# Patient Record
Sex: Female | Born: 1995 | Race: Black or African American | Hispanic: No | Marital: Single | Smoking: Never smoker
Health system: Southern US, Community
[De-identification: ages and names within clinical notes are randomized; demographics above are authoritative.]

## PROBLEM LIST (undated history)

## (undated) DIAGNOSIS — D649 Anemia, unspecified: Secondary | ICD-10-CM

---

## 2019-11-11 ENCOUNTER — Inpatient Hospital Stay (HOSPITAL_COMMUNITY)
Admission: EM | Admit: 2019-11-11 | Discharge: 2019-11-14 | DRG: 871 | Disposition: A | Discharge status: Home / Self Care | Payer: Self-pay | Attending: Internal Medicine | Admitting: Internal Medicine

## 2019-11-11 ENCOUNTER — Encounter (HOSPITAL_COMMUNITY): Payer: Self-pay | Admitting: Emergency Medicine

## 2019-11-11 ENCOUNTER — Emergency Department (HOSPITAL_COMMUNITY): Payer: Self-pay

## 2019-11-11 DIAGNOSIS — D649 Anemia, unspecified: Secondary | ICD-10-CM

## 2019-11-11 DIAGNOSIS — R112 Nausea with vomiting, unspecified: Secondary | ICD-10-CM

## 2019-11-11 DIAGNOSIS — E871 Hypo-osmolality and hyponatremia: Secondary | ICD-10-CM | POA: Diagnosis present

## 2019-11-11 DIAGNOSIS — E669 Obesity, unspecified: Secondary | ICD-10-CM | POA: Diagnosis present

## 2019-11-11 DIAGNOSIS — U071 COVID-19: Secondary | ICD-10-CM | POA: Diagnosis present

## 2019-11-11 DIAGNOSIS — E872 Acidosis: Secondary | ICD-10-CM | POA: Diagnosis present

## 2019-11-11 DIAGNOSIS — N920 Excessive and frequent menstruation with regular cycle: Secondary | ICD-10-CM | POA: Diagnosis present

## 2019-11-11 DIAGNOSIS — J1282 Pneumonia due to coronavirus disease 2019: Secondary | ICD-10-CM | POA: Diagnosis present

## 2019-11-11 DIAGNOSIS — A4189 Other specified sepsis: Principal | ICD-10-CM | POA: Diagnosis present

## 2019-11-11 DIAGNOSIS — A419 Sepsis, unspecified organism: Secondary | ICD-10-CM

## 2019-11-11 DIAGNOSIS — R0602 Shortness of breath: Secondary | ICD-10-CM

## 2019-11-11 DIAGNOSIS — J189 Pneumonia, unspecified organism: Secondary | ICD-10-CM | POA: Diagnosis present

## 2019-11-11 DIAGNOSIS — D500 Iron deficiency anemia secondary to blood loss (chronic): Secondary | ICD-10-CM | POA: Diagnosis present

## 2019-11-11 DIAGNOSIS — R739 Hyperglycemia, unspecified: Secondary | ICD-10-CM | POA: Diagnosis not present

## 2019-11-11 DIAGNOSIS — Z6837 Body mass index (BMI) 37.0-37.9, adult: Secondary | ICD-10-CM

## 2019-11-11 DIAGNOSIS — D573 Sickle-cell trait: Secondary | ICD-10-CM | POA: Diagnosis present

## 2019-11-11 DIAGNOSIS — E876 Hypokalemia: Secondary | ICD-10-CM | POA: Diagnosis present

## 2019-11-11 HISTORY — DX: Anemia, unspecified: D64.9

## 2019-11-11 LAB — CBC
HCT: 22.6 % — ABNORMAL LOW (ref 36.0–46.0)
Hemoglobin: 5.9 g/dL — CL (ref 12.0–15.0)
MCH: 14.9 pg — ABNORMAL LOW (ref 26.0–34.0)
MCHC: 26.1 g/dL — ABNORMAL LOW (ref 30.0–36.0)
MCV: 56.9 fL — ABNORMAL LOW (ref 80.0–100.0)
Platelets: 192 10*3/uL (ref 150–400)
RBC: 3.97 MIL/uL (ref 3.87–5.11)
RDW: 22.2 % — ABNORMAL HIGH (ref 11.5–15.5)
WBC: 3.9 10*3/uL — ABNORMAL LOW (ref 4.0–10.5)
nRBC: 0.5 % — ABNORMAL HIGH (ref 0.0–0.2)

## 2019-11-11 LAB — BASIC METABOLIC PANEL
Anion gap: 10 (ref 5–15)
BUN: <5 mg/dL — ABNORMAL LOW (ref 6–20)
CO2: 23 mmol/L (ref 22–32)
Calcium: 8.6 mg/dL — ABNORMAL LOW (ref 8.9–10.3)
Chloride: 100 mmol/L (ref 98–111)
Creatinine, Ser: 0.86 mg/dL (ref 0.44–1.00)
GFR calc Af Amer: >60 mL/min (ref >60)
GFR calc non Af Amer: >60 mL/min (ref >60)
Glucose, Bld: 108 mg/dL — ABNORMAL HIGH (ref 70–99)
Potassium: 3.3 mmol/L — ABNORMAL LOW (ref 3.5–5.1)
Sodium: 133 mmol/L — ABNORMAL LOW (ref 135–145)

## 2019-11-11 LAB — I-STAT BETA HCG BLOOD, ED (MC, WL, AP ONLY): I-stat hCG, quantitative: <5 m[IU]/mL (ref <5)

## 2019-11-11 NOTE — ED Triage Notes (Signed)
Patient reports being diagnosed with COVID last week. Reports ongoing fever/chills, body aches, nausea.

## 2019-11-12 ENCOUNTER — Encounter (HOSPITAL_COMMUNITY): Payer: Self-pay | Admitting: Emergency Medicine

## 2019-11-12 DIAGNOSIS — J1282 Pneumonia due to coronavirus disease 2019: Secondary | ICD-10-CM | POA: Diagnosis not present

## 2019-11-12 DIAGNOSIS — U071 COVID-19: Secondary | ICD-10-CM | POA: Diagnosis not present

## 2019-11-12 DIAGNOSIS — B342 Coronavirus infection, unspecified: Secondary | ICD-10-CM

## 2019-11-12 DIAGNOSIS — N920 Excessive and frequent menstruation with regular cycle: Secondary | ICD-10-CM

## 2019-11-12 DIAGNOSIS — D649 Anemia, unspecified: Secondary | ICD-10-CM

## 2019-11-12 DIAGNOSIS — J189 Pneumonia, unspecified organism: Secondary | ICD-10-CM | POA: Diagnosis present

## 2019-11-12 DIAGNOSIS — A419 Sepsis, unspecified organism: Secondary | ICD-10-CM

## 2019-11-12 DIAGNOSIS — R112 Nausea with vomiting, unspecified: Secondary | ICD-10-CM

## 2019-11-12 HISTORY — DX: COVID-19: U07.1

## 2019-11-12 HISTORY — DX: Coronavirus infection, unspecified: B34.2

## 2019-11-12 LAB — LACTATE DEHYDROGENASE
LDH: 385 U/L — ABNORMAL HIGH (ref 98–192)
LDH: 231 U/L — ABNORMAL HIGH (ref 98–192)

## 2019-11-12 LAB — FERRITIN
Ferritin: 11 ng/mL (ref 11–307)
Ferritin: 13 ng/mL (ref 11–307)
Ferritin: 11 ng/mL (ref 11–307)

## 2019-11-12 LAB — RETICULOCYTES
Immature Retic Fract: 23.5 % — ABNORMAL HIGH (ref 2.3–15.9)
RBC.: 4.07 MIL/uL (ref 3.87–5.11)
Retic Count, Absolute: 44.8 10*3/uL (ref 19.0–186.0)
Retic Ct Pct: 1.1 % (ref 0.4–3.1)

## 2019-11-12 LAB — CBC WITH DIFFERENTIAL/PLATELET
Abs Immature Granulocytes: 0.03 10*3/uL (ref 0.00–0.07)
Basophils Absolute: 0 10*3/uL (ref 0.0–0.1)
Basophils Relative: 0 %
Eosinophils Absolute: 0 10*3/uL (ref 0.0–0.5)
Eosinophils Relative: 0 %
HCT: 30.3 % — ABNORMAL LOW (ref 36.0–46.0)
Hemoglobin: 8.6 g/dL — ABNORMAL LOW (ref 12.0–15.0)
Immature Granulocytes: 1 %
Lymphocytes Relative: 22 %
Lymphs Abs: 0.8 10*3/uL (ref 0.7–4.0)
MCH: 18 pg — ABNORMAL LOW (ref 26.0–34.0)
MCHC: 28.4 g/dL — ABNORMAL LOW (ref 30.0–36.0)
MCV: 63.5 fL — ABNORMAL LOW (ref 80.0–100.0)
Monocytes Absolute: 0.1 10*3/uL (ref 0.1–1.0)
Monocytes Relative: 1 %
Neutro Abs: 2.7 10*3/uL (ref 1.7–7.7)
Neutrophils Relative %: 76 %
Platelets: 184 10*3/uL (ref 150–400)
RBC: 4.77 MIL/uL (ref 3.87–5.11)
RDW: 28.8 % — ABNORMAL HIGH (ref 11.5–15.5)
WBC: 3.6 10*3/uL — ABNORMAL LOW (ref 4.0–10.5)
nRBC: 0 % (ref 0.0–0.2)

## 2019-11-12 LAB — FOLATE: Folate: 28.8 ng/mL (ref >5.9)

## 2019-11-12 LAB — HEPATIC FUNCTION PANEL
ALT: 21 U/L (ref 0–44)
AST: 52 U/L — ABNORMAL HIGH (ref 15–41)
Albumin: 3.4 g/dL — ABNORMAL LOW (ref 3.5–5.0)
Alkaline Phosphatase: 33 U/L — ABNORMAL LOW (ref 38–126)
Bilirubin, Direct: 0.3 mg/dL — ABNORMAL HIGH (ref 0.0–0.2)
Indirect Bilirubin: 0.8 mg/dL (ref 0.3–0.9)
Total Bilirubin: 1.1 mg/dL (ref 0.3–1.2)
Total Protein: 7.1 g/dL (ref 6.5–8.1)

## 2019-11-12 LAB — SARS CORONAVIRUS 2 BY RT PCR (HOSPITAL ORDER, PERFORMED IN ~~LOC~~ HOSPITAL LAB): SARS Coronavirus 2: POSITIVE — AB

## 2019-11-12 LAB — D-DIMER, QUANTITATIVE
D-Dimer, Quant: 1.06 ug/mL-FEU — ABNORMAL HIGH (ref 0.00–0.50)
D-Dimer, Quant: 1.01 ug/mL-FEU — ABNORMAL HIGH (ref 0.00–0.50)

## 2019-11-12 LAB — PROCALCITONIN: Procalcitonin: <0.1 ng/mL

## 2019-11-12 LAB — COMPREHENSIVE METABOLIC PANEL
ALT: 19 U/L (ref 0–44)
AST: 26 U/L (ref 15–41)
Albumin: 3.1 g/dL — ABNORMAL LOW (ref 3.5–5.0)
Alkaline Phosphatase: 39 U/L (ref 38–126)
Anion gap: 9 (ref 5–15)
BUN: 5 mg/dL — ABNORMAL LOW (ref 6–20)
CO2: 21 mmol/L — ABNORMAL LOW (ref 22–32)
Calcium: 8.4 mg/dL — ABNORMAL LOW (ref 8.9–10.3)
Chloride: 107 mmol/L (ref 98–111)
Creatinine, Ser: 0.76 mg/dL (ref 0.44–1.00)
GFR calc Af Amer: >60 mL/min (ref >60)
GFR calc non Af Amer: >60 mL/min (ref >60)
Glucose, Bld: 196 mg/dL — ABNORMAL HIGH (ref 70–99)
Potassium: 3.7 mmol/L (ref 3.5–5.1)
Sodium: 137 mmol/L (ref 135–145)
Total Bilirubin: 0.5 mg/dL (ref 0.3–1.2)
Total Protein: 6.9 g/dL (ref 6.5–8.1)

## 2019-11-12 LAB — PREPARE RBC (CROSSMATCH)

## 2019-11-12 LAB — FIBRINOGEN
Fibrinogen: 355 mg/dL (ref 210–475)
Fibrinogen: 394 mg/dL (ref 210–475)

## 2019-11-12 LAB — PHOSPHORUS: Phosphorus: 2.4 mg/dL — ABNORMAL LOW (ref 2.5–4.6)

## 2019-11-12 LAB — C-REACTIVE PROTEIN
CRP: 1.2 mg/dL — ABNORMAL HIGH (ref <1.0)
CRP: 1.6 mg/dL — ABNORMAL HIGH (ref <1.0)

## 2019-11-12 LAB — ABO/RH: ABO/RH(D): A POS

## 2019-11-12 LAB — SEDIMENTATION RATE: Sed Rate: 22 mm/hr (ref 0–22)

## 2019-11-12 LAB — MAGNESIUM
Magnesium: 1.8 mg/dL (ref 1.7–2.4)
Magnesium: 2 mg/dL (ref 1.7–2.4)

## 2019-11-12 LAB — LACTIC ACID, PLASMA: Lactic Acid, Venous: 1.6 mmol/L (ref 0.5–1.9)

## 2019-11-12 LAB — IRON AND TIBC
Iron: 13 ug/dL — ABNORMAL LOW (ref 28–170)
Saturation Ratios: 3 % — ABNORMAL LOW (ref 10.4–31.8)
TIBC: 372 ug/dL (ref 250–450)
UIBC: 359 ug/dL

## 2019-11-12 LAB — HIV ANTIBODY (ROUTINE TESTING W REFLEX): HIV Screen 4th Generation wRfx: NONREACTIVE

## 2019-11-12 LAB — VITAMIN B12: Vitamin B-12: 751 pg/mL (ref 180–914)

## 2019-11-12 LAB — LIPASE, BLOOD: Lipase: 24 U/L (ref 11–51)

## 2019-11-12 MED ORDER — ONDANSETRON HCL 4 MG/2ML IJ SOLN: 4.0000 mg | Freq: Four times a day (QID) | INTRAMUSCULAR | Status: DC | PRN

## 2019-11-12 MED ORDER — HYDROCOD POLST-CPM POLST ER 10-8 MG/5ML PO SUER: 5.0000 mL | Freq: Two times a day (BID) | ORAL | Status: DC | PRN

## 2019-11-12 MED ORDER — GUAIFENESIN-DM 100-10 MG/5ML PO SYRP: 10.0000 mL | ORAL_SOLUTION | ORAL | Status: DC | PRN

## 2019-11-12 MED ORDER — SODIUM CHLORIDE 0.9 % IV SOLN
10.0000 mL/h | Freq: Once | INTRAVENOUS | Status: AC
  Administered 2019-11-12: 10 mL/h via INTRAVENOUS

## 2019-11-12 MED ORDER — SODIUM CHLORIDE 0.9 % IV SOLN
200.0000 mg | Freq: Once | INTRAVENOUS | Status: AC
  Administered 2019-11-12: 200 mg via INTRAVENOUS
  Filled 2019-11-12: qty 40

## 2019-11-12 MED ORDER — ACETAMINOPHEN 325 MG PO TABS
650.0000 mg | ORAL_TABLET | Freq: Four times a day (QID) | ORAL | Status: DC | PRN
  Administered 2019-11-12: 650 mg via ORAL
  Filled 2019-11-12: qty 2

## 2019-11-12 MED ORDER — SODIUM CHLORIDE 0.9 % IV BOLUS
1000.0000 mL | Freq: Once | INTRAVENOUS | Status: AC
  Administered 2019-11-12: 1000 mL via INTRAVENOUS

## 2019-11-12 MED ORDER — METHYLPREDNISOLONE SODIUM SUCC 125 MG IJ SOLR
0.5000 mg/kg | Freq: Two times a day (BID) | INTRAMUSCULAR | Status: DC
  Administered 2019-11-12 – 2019-11-14 (×5): 53.125 mg via INTRAVENOUS
  Filled 2019-11-12 (×5): qty 2

## 2019-11-12 MED ORDER — LACTATED RINGERS IV BOLUS
1000.0000 mL | Freq: Once | INTRAVENOUS | Status: AC
  Administered 2019-11-12: 1000 mL via INTRAVENOUS

## 2019-11-12 MED ORDER — ONDANSETRON HCL 4 MG/2ML IJ SOLN
4.0000 mg | Freq: Once | INTRAMUSCULAR | Status: AC
  Administered 2019-11-12: 4 mg via INTRAVENOUS
  Filled 2019-11-12: qty 2

## 2019-11-12 MED ORDER — VITAMIN D 25 MCG (1000 UNIT) PO TABS
1000.0000 [IU] | ORAL_TABLET | Freq: Every day | ORAL | Status: DC
  Administered 2019-11-12 – 2019-11-14 (×3): 1000 [IU] via ORAL
  Filled 2019-11-12 (×3): qty 1

## 2019-11-12 MED ORDER — POTASSIUM CHLORIDE CRYS ER 20 MEQ PO TBCR
40.0000 meq | EXTENDED_RELEASE_TABLET | Freq: Once | ORAL | Status: AC
  Administered 2019-11-12: 40 meq via ORAL
  Filled 2019-11-12: qty 2

## 2019-11-12 MED ORDER — ASCORBIC ACID 500 MG PO TABS
500.0000 mg | ORAL_TABLET | Freq: Every day | ORAL | Status: DC
  Administered 2019-11-12 – 2019-11-14 (×3): 500 mg via ORAL
  Filled 2019-11-12 (×3): qty 1

## 2019-11-12 MED ORDER — SODIUM CHLORIDE 0.9 % IV SOLN
100.0000 mg | Freq: Every day | INTRAVENOUS | Status: DC
  Administered 2019-11-13 – 2019-11-14 (×2): 100 mg via INTRAVENOUS
  Filled 2019-11-12 (×3): qty 20

## 2019-11-12 MED ORDER — K PHOS MONO-SOD PHOS DI & MONO 155-852-130 MG PO TABS
500.0000 mg | ORAL_TABLET | Freq: Two times a day (BID) | ORAL | Status: AC
  Administered 2019-11-12: 500 mg via ORAL
  Filled 2019-11-12 (×2): qty 2

## 2019-11-12 MED ORDER — ZINC SULFATE 220 (50 ZN) MG PO CAPS
220.0000 mg | ORAL_CAPSULE | Freq: Every day | ORAL | Status: DC
  Administered 2019-11-12 – 2019-11-14 (×3): 220 mg via ORAL
  Filled 2019-11-12 (×3): qty 1

## 2019-11-12 MED ORDER — SODIUM CHLORIDE 0.9 % IV BOLUS: 500.0000 mL | Freq: Once | INTRAVENOUS | Status: DC

## 2019-11-12 NOTE — H&P (Signed)
History and Physical    Carly Bullock JSE:[id number] DOB: [birth year] DOA: 11/11/2019  PCP: Patient, No Pcp Per Patient coming from: Home  Chief Complaint: Vomiting, Covid positive  HPI: Carly Bullock is a 24 y.o. female with medical history significant of anemia presenting to the ED for evaluation of nausea and vomiting after recently testing positive for Covid last week.  Patient reports having a cough for several days and feeling weak.  States she is tested positive for Covid on 8/18.  For the last 3 days she is having nausea and vomiting.  She has not been able to tolerate any p.o. intake.  Denies abdominal pain or diarrhea.  Denies lightheadedness/dizziness, shortness of breath, chest pain, or palpitations.  Patient thinks she had a miscarriage back in February and since then she has continued to have heavy menstrual cycles.  Each menstrual cycle lasts 2 weeks at a time.  She has not seen a gynecologist.  Denies hematemesis, hematochezia, or melena.  Reports history of sickle cell trait.  ED Course: Febrile and tachycardic.  Not tachypneic and satting 100% on room air.  WBC 3.9, hemoglobin 5.9, hematocrit 22.6, MCV 56.9, and platelet count 192k.  Sodium 133, potassium 3.3, chloride 100, bicarb 23, BUN <5, creatinine 0.8, and glucose 108.  Beta hCG negative.  Chest x-ray showing patchy airspace opacities in the right lung base concerning for pneumonia.  Patient was given Zofran, potassium supplementation, and 1 L LR bolus.  2 units PRBCs ordered.  Review of Systems:  All systems reviewed and apart from history of presenting illness, are negative.  Past Medical History:  Diagnosis Date  . Anemia     History reviewed. No pertinent surgical history.   reports that she has never smoked. She has never used smokeless tobacco. She reports current alcohol use. She reports previous drug use.  No Known Allergies  Family History  Problem Relation Age of Onset  . Breast cancer Mother      Prior to Admission medications   Not on File    Physical Exam: Vitals:   11/11/19 2203 11/11/19 2208 11/12/19 0101 11/12/19 0250  BP: 121/65  117/70 111/73  Pulse: (!) 117  (!) 104 (!) 106  Resp: 16  15 20   Temp: 100.2 F (37.9 C)  (!) 100.7 F (38.2 C) (!) 100.8 F (38.2 C)  TempSrc: Oral  Oral Oral  SpO2: 100%  100% 100%  Weight:  106.6 kg    Height:  5\' 5"  (1.651 m)      Physical Exam Constitutional:      Comments: Appears lethargic  HENT:     Head: Normocephalic and atraumatic.  Eyes:     Extraocular Movements: Extraocular movements intact.     Conjunctiva/sclera: Conjunctivae normal.  Cardiovascular:     Rate and Rhythm: Normal rate and regular rhythm.     Pulses: Normal pulses.  Pulmonary:     Effort: Pulmonary effort is normal. No respiratory distress.     Breath sounds: Normal breath sounds. No wheezing or rales.  Abdominal:     General: Bowel sounds are normal. There is no distension.     Palpations: Abdomen is soft.     Tenderness: There is no abdominal tenderness.  Musculoskeletal:        General: No swelling or tenderness.     Cervical back: Normal range of motion and neck supple.  Skin:    General: Skin is warm and dry.  Neurological:     General: No focal deficit  present.     Mental Status: She is alert and oriented to person, place, and time.     Labs on Admission: I have personally reviewed following labs and imaging studies  CBC: Recent Labs  Lab 11/11/19 2213  WBC 3.9*  HGB 5.9*  HCT 22.6*  MCV 56.9*  PLT 192   Basic Metabolic Panel: Recent Labs  Lab 11/11/19 2213  NA 133*  K 3.3*  CL 100  CO2 23  GLUCOSE 108*  BUN <5*  CREATININE 0.86  CALCIUM 8.6*   GFR: Estimated Creatinine Clearance: 120.2 mL/min (by C-G formula based on SCr of 0.86 mg/dL). Liver Function Tests: No results for input(s): AST, ALT, ALKPHOS, BILITOT, PROT, ALBUMIN in the last 168 hours. No results for input(s): LIPASE, AMYLASE in the last 168  hours. No results for input(s): AMMONIA in the last 168 hours. Coagulation Profile: No results for input(s): INR, PROTIME in the last 168 hours. Cardiac Enzymes: No results for input(s): CKTOTAL, CKMB, CKMBINDEX, TROPONINI in the last 168 hours. BNP (last 3 results) No results for input(s): PROBNP in the last 8760 hours. HbA1C: No results for input(s): HGBA1C in the last 72 hours. CBG: No results for input(s): GLUCAP in the last 168 hours. Lipid Profile: No results for input(s): CHOL, HDL, LDLCALC, TRIG, CHOLHDL, LDLDIRECT in the last 72 hours. Thyroid Function Tests: No results for input(s): TSH, T4TOTAL, FREET4, T3FREE, THYROIDAB in the last 72 hours. Anemia Panel: Recent Labs    11/12/19 0046  VITAMINB12 751  FOLATE 28.8  FERRITIN 11  TIBC 372  IRON 13*  RETICCTPCT 1.1   Urine analysis: No results found for: COLORURINE, APPEARANCEUR, LABSPEC, PHURINE, GLUCOSEU, HGBUR, BILIRUBINUR, KETONESUR, PROTEINUR, UROBILINOGEN, NITRITE, LEUKOCYTESUR  Radiological Exams on Admission: DG Chest Port 1 View  Result Date: 11/11/2019 CLINICAL DATA:  COVID-19 positivity EXAM: PORTABLE CHEST 1 VIEW COMPARISON:  None. FINDINGS: Cardiac shadows within normal limits. Patchy airspace opacities are noted in the right lung base consistent with the given clinical history. No sizable effusion is noted. No bony abnormality is noted. IMPRESSION: Patchy airspace opacities in the right base which would be consistent with the given clinical history. Electronically Signed   By: Alcide Clever M.D.   On: 11/11/2019 23:11    EKG: Ordered and currently pending.      Assessment/Plan Principal Problem:   Pneumonia due to COVID-19 virus Active Problems:   Sepsis (HCC)   Nausea and vomiting   Symptomatic anemia   Menorrhagia   Sepsis secondary to COVID-19 viral pneumonia: Tested positive for Covid 8/18.  Complaining of a cough.  Febrile and slightly tachycardic. Not hypoxic-satting 100% on room air.   Chest x-ray showing patchy airspace opacities in the right lung base concerning for pneumonia.  Bacterial pneumonia less likely given no leukocytosis on labs. -Received 1 L IV fluid bolus in the ED, order additional 1 L bolus as patient continues to be slightly tachycardic and blood pressure low with systolic around 90. -Remdesivir dosing per pharmacy -IV Solu-Medrol 0.5 mg/kg every 12 hours -Vitamin C, zinc, vitamin D -Antitussives as needed -Tylenol as needed -Check inflammatory markers including ferritin, fibrinogen, D-dimer, CRP, LDH -Check lactic acid level -Check procalcitonin level -Daily CBC with differential, CMP, CRP, D-dimer -Airborne and contact precautions -Continuous pulse ox -Supplemental oxygen as needed to keep oxygen saturation above 90% -Blood culture x2 ordered  Nausea and emesis: Suspect due to COVID-19 viral gastroenteritis.  No complaints of abdominal pain and abdominal exam benign.  Beta hCG negative.  Not  actively vomiting at this time and able to drink fluids. -Check lipase and LFTs.  Antiemetic as needed.  Symptomatic blood loss anemia/iron deficiency anemia: Hemoglobin 5.9 and MCV 56.9.  No prior labs for comparison.  Anemia panel showing very low iron saturation of 3% and low ferritin of 11.  Patient reports having heavy menstrual cycles which last 2 weeks at a time since February.  Denies any symptoms of GI bleed. -Type and screen, 2 units PRBCs ordered.  Monitor H&H.  Menorrhagia: Likely contributing to her anemia. -Please ensure gynecology follow-up.  Mild hypokalemia: In setting of intractable nausea and vomiting.  Potassium 3.3. -Potassium supplementation given.  Check magnesium level and replete if low.  Continue to monitor electrolytes.  DVT prophylaxis: SCDs Code Status: Full code Family Communication: No family available at this time. Disposition Plan: Status is: Inpatient  Remains inpatient appropriate because:IV treatments appropriate due to  intensity of illness or inability to take PO and Inpatient level of care appropriate due to severity of illness   Dispo: The patient is from: Home              Anticipated d/c is to: Home              Anticipated d/c date is: 3 days              Patient currently is not medically stable to d/c.  The medical decision making on this patient was of high complexity and the patient is at high risk for clinical deterioration, therefore this is a level 3 visit.  John Giovanni MD [facility]  If 7PM-7AM, please contact night-coverage [email or web address]  11/12/2019, 3:08 AM

## 2019-11-12 NOTE — ED Notes (Signed)
Pt given toiletries.  States she feels much better and feels like eating breakfast.

## 2019-11-12 NOTE — ED Provider Notes (Signed)
[facility] EMERGENCY DEPARTMENT Provider Note   CSN: [id number] Arrival date & time: 11/11/19  2202   History Chief Complaint  Patient presents with  . COVID+    Carly Bullock is a 24 y.o. female.  The history is provided by the patient.  She has no significant history and comes in because of vomiting for the last 3 days.  She has not been able to hold anything down.  She started getting sick 8 days ago with fever, sore throat, cough productive of clear to pale yellow sputum.  6 days ago, she was tested for COVID-19 and was found to be positive.  Other symptoms include loss of sense of smell and taste.  She has had some generalized body aches.  Fever which has been present initially, has resolved but she continues to have chills.  She is no longer having sweats.  She denies any diarrhea.  She had not been vaccinated against COVID-19.  History reviewed. No pertinent past medical history.  There are no problems to display for this patient.   History reviewed. No pertinent surgical history.   OB History   No obstetric history on file.     No family history on file.  Social History   Tobacco Use  . Smoking status: Never Smoker  . Smokeless tobacco: Never Used  Substance Use Topics  . Alcohol use: Yes    Comment: Socially  . Drug use: Not Currently    Home Medications Prior to Admission medications   Not on File    Allergies    Patient has no known allergies.  Review of Systems   Review of Systems  All other systems reviewed and are negative.   Physical Exam Updated Vital Signs BP 121/65 (BP Location: Left Arm)   Pulse (!) 117   Temp 100.2 F (37.9 C) (Oral)   Resp 16   Ht 5\' 5"  (1.651 m)   Wt 106.6 kg   SpO2 100%   BMI 39.11 kg/m   Physical Exam Vitals and nursing note reviewed.   24 year old female, resting comfortably and in no acute distress. Vital signs are significant for rapid heart rate and borderline fever. Oxygen  saturation is 100%, which is normal. Head is normocephalic and atraumatic. PERRLA, EOMI. Oropharynx is clear.  Conjunctivae are pale. Neck is nontender and supple without adenopathy or JVD. Back is nontender and there is no CVA tenderness. Lungs are clear without rales, wheezes, or rhonchi. Chest is nontender. Heart has regular rate and rhythm without murmur. Abdomen is soft, flat, nontender without masses or hepatosplenomegaly and peristalsis is hypoactive. Extremities have no cyanosis or edema, full range of motion is present. Skin is warm and dry without rash. Neurologic: Mental status is normal, cranial nerves are intact, there are no motor or sensory deficits.  ED Results / Procedures / Treatments   Labs (all labs ordered are listed, but only abnormal results are displayed) Labs Reviewed  CBC - Abnormal; Notable for the following components:      Result Value   WBC 3.9 (*)    Hemoglobin 5.9 (*)    HCT 22.6 (*)    MCV 56.9 (*)    MCH 14.9 (*)    MCHC 26.1 (*)    RDW 22.2 (*)    nRBC 0.5 (*)    All other components within normal limits  BASIC METABOLIC PANEL - Abnormal; Notable for the following components:   Sodium 133 (*)    Potassium 3.3 (*)  Glucose, Bld 108 (*)    BUN <5 (*)    Calcium 8.6 (*)    All other components within normal limits  I-STAT BETA HCG BLOOD, ED ([facility], [facility], [facility] ONLY)    EKG None  Radiology DG Chest Port 1 View  Result Date: 11/11/2019 CLINICAL DATA:  COVID-19 positivity EXAM: PORTABLE CHEST 1 VIEW COMPARISON:  None. FINDINGS: Cardiac shadows within normal limits. Patchy airspace opacities are noted in the right lung base consistent with the given clinical history. No sizable effusion is noted. No bony abnormality is noted. IMPRESSION: Patchy airspace opacities in the right base which would be consistent with the given clinical history. Electronically Signed   By: Alcide Clever M.D.   On: 11/11/2019 23:11    Procedures Procedures  CRITICAL  CARE Performed by: Dione Booze Total critical care time: 35 minutes Critical care time was exclusive of separately billable procedures and treating other patients. Critical care was necessary to treat or prevent imminent or life-threatening deterioration. Critical care was time spent personally by me on the following activities: development of treatment plan with patient and/or surrogate as well as nursing, discussions with consultants, evaluation of patient's response to treatment, examination of patient, obtaining history from patient or surrogate, ordering and performing treatments and interventions, ordering and review of laboratory studies, ordering and review of radiographic studies, pulse oximetry and re-evaluation of patient's condition.  Medications Ordered in ED Medications  0.9 %  sodium chloride infusion (has no administration in time range)  ondansetron (ZOFRAN) injection 4 mg (4 mg Intravenous Given 11/12/19 0108)  lactated ringers bolus 1,000 mL (1,000 mLs Intravenous New Bag/Given 11/12/19 0108)  potassium chloride SA (KLOR-CON) CR tablet 40 mEq (40 mEq Oral Given 11/12/19 0108)    ED Course  I have reviewed the triage vital signs and the nursing notes.  Pertinent labs & imaging results that were available during my care of the patient were reviewed by me and considered in my medical decision making (see chart for details).  MDM Rules/Calculators/A&P COVID-19 infection with nausea and vomiting.  Oxygen saturations are adequate.  She is tachycardic which is felt to be due to combination of dehydration and low-grade fever.  Labs show normal BUN and creatinine, slightly low sodium and potassium secondary to vomiting.  Hemoglobin is markedly low at 5.9 with no prior hemoglobin available for comparison.  MCV is markedly low at 56.9.  She does have history of heavy menses and this is probably secondary to blood loss from menorrhagia.  She also does relate a history of sickle cell trait  which may be contributing to her anemia.  Given combination of intractable vomiting and very low hemoglobin, I feel she should be admitted for ongoing IV hydration and blood transfusion.  Blood is drawn for anemia panel.  She has no prior records in the [facility] health system or in [facility]. Case is discussed with Dr. Loney Loh of [facility], who agrees to admit the patient.  Carly Bullock was evaluated in Emergency Department on 11/12/2019 for the symptoms described in the history of present illness. She was evaluated in the context of the global COVID-19 pandemic, which necessitated consideration that the patient might be at risk for infection with the SARS-CoV-2 virus that causes COVID-19. Institutional protocols and algorithms that pertain to the evaluation of patients at risk for COVID-19 are in a state of rapid change based on information released by regulatory bodies including the CDC and federal and state organizations. These policies and algorithms were followed  during the patient's care in the ED.  Final Clinical Impression(s) / ED Diagnoses Final diagnoses:  Symptomatic anemia  COVID-19 virus infection  Non-intractable vomiting with nausea, unspecified vomiting type  Hyponatremia  Hypokalemia    Rx / DC Orders ED Discharge Orders    None       Dione Booze, MD 11/12/19 0116

## 2019-11-12 NOTE — ED Notes (Signed)
Ordered breakfast 

## 2019-11-12 NOTE — Progress Notes (Signed)
Care started prior to midnight in the emergency room and patient was admitted early this morning after midnight by Dr. Shela Leff and I am in current agreement with her assessment and plan.  Additional changes to the plan been made accordingly.  The patient is a 24 year old female with past medical history significant for anemia who presented to the ED for evaluation of nausea vomiting after recently testing positive for Covid last week.  She reported having cough for several days and feeling weak and tested positive for Covid 11/05/2019.  For last 3 days she started having nausea and vomiting and had not been able to take anything p.o.  She denied any abdominal pain or diarrhea.  She thinks that she had a miscarriage back in February and since then she is continued to have extremely heavy menstrual cycles.  She states that each menstrual cycle last 2 weeks and she has not seen a gynecologist and denies any hematemesis, hematochezia or any melena.  Reports history of having sickle cell trait.  In the ED she is found to be febrile and tachycardic but she is not tachypneic and saturating 100% on room air.  Chest x-ray was done showing patchy airspace opacities in the right lung base concerning for pneumonia.  In the ED she is given Zofran, potassium supplementation given her low potassium level 2.3, 1 L lactated Ringer bolus as well as 2 units of PRBCs.  Currently she is being admitted for and being treated for the following but not limited to:  Sepsis secondary to COVID-19 viral pneumonia -Tested positive for Covid 8/18.  Complaining of a cough.   -Met Sepsis Criteria as she was Febrile at 101.4 and slightly tachycardic at 119 and Tachypenic at 27 with a Pneumonia from [place] -Not hypoxic-satting 100% on room air and no evidence of End-organ damage -Chest x-ray showing patchy airspace opacities in the right lung base concerning for pneumonia.  Bacterial pneumonia less likely given no leukocytosis on  labs. -Received 1 L IV fluid bolus in the ED, order additional 1 L bolus as patient continues to be slightly tachycardic and blood pressure low with systolic around 90. -Started Remdesivir dosing per pharmacy -IV Solu-Medrol 0.5 mg/kg every 12 hours -Vitamin C, zinc, vitamin D -Antitussives as needed with Tussionex and Robitussin DM  -Tylenol as needed -Check inflammatory markers including ferritin, fibrinogen, D-dimer, CRP, LDH -Check lactic acid level -Check procalcitonin level -Daily CBC with differential, CMP, CRP, D-dimer -Inflammatory Marker Trend: Recent Labs    11/12/19 0046 11/12/19 0310 11/12/19 1115  DDIMER  --  1.06* 1.01*  FERRITIN $RemoveB'11 13 11  '[id number]$ LDH  --  385* 231*  CRP  --  1.2* 1.6*  -PCT was <0.10  Lab Results  Component Value Date   SARSCOV2NAA POSITIVE (A) 11/12/2019   -SpO2: 98 % on Room Air -Airborne and contact precautions -Continuous pulse ox -Supplemental oxygen as needed to keep oxygen saturation above 90% -Blood culture x2 ordered and pending -Repeat CXR in the AM   Nausea and Emesis -Suspect due to COVID-19 viral gastroenteritis.   -No complaints of abdominal pain and abdominal exam benign.   -Beta hCG negative.  Not actively vomiting at this time and able to drink fluids. -Checked Lipase at 24 and LFTs (normal now but had an elevated AST)   -C/w Antiemetics as needed.  Symptomatic blood loss anemia/iron deficiency anemia -Hemoglobin 5.9 and MCV 56.9.  No prior labs for comparison.  -Anemia panel showing very low iron of 13, saturation of  3% and low ferritin of 11, UIBC of 359, TIBC of 372.   -Patient reports having heavy menstrual cycles which last 2 weeks at a time since February.  Denies any symptoms of GI bleed. -Type and screen, 2 units PRBCs ordered.  Monitor H&H. -Repeat Hgb/Hct was 8.6/30.3 -Continue to Monitor for S/Sx of Bleeding; Currently no overt bleeding noted -Repeat CBC in the AM   Menorrhagia -Likely contributing to her  anemia. -Will need to Ensure gynecology follow-up.  Elevated AST -In the setting of Covid 19 disease -Improved now as AST went from 52 -> 26  Leukopenia -Patient's WBC went from 3.9 -> 3.6 -Continue to Monitor and Trend and Repeat CBC in the AM   Mild Hypokalemia -In setting of intractable nausea and vomiting.  Potassium 3.3 on admission and is now 3.7 -Potassium supplementation given.  Checked magnesium level and was 1.8 -> 2.0   -Continue to monitor electrolytes.  Metabolic Acidosis -Mild. CO2 was 21, Chloride Level was 107, and AG is 9 -Continue to Monitor and Trend -Repeat CBC in the AM   Obesity -Estimated body mass index is 39.11 kg/m as calculated from the following:   Height as of this encounter: $RemoveBeforeD'5\' 5"'[id number]$  (1.651 m).   Weight as of this encounter: 106.6 kg.  -Continued Weight Loss and Dietary Counseling  We will continue to monitor the patient's clinical response to intervention and repeat blood work and imaging in the a.m.

## 2019-11-13 ENCOUNTER — Encounter (HOSPITAL_COMMUNITY): Payer: Self-pay | Admitting: Internal Medicine

## 2019-11-13 ENCOUNTER — Other Ambulatory Visit: Payer: Self-pay

## 2019-11-13 DIAGNOSIS — R112 Nausea with vomiting, unspecified: Secondary | ICD-10-CM | POA: Diagnosis not present

## 2019-11-13 DIAGNOSIS — N920 Excessive and frequent menstruation with regular cycle: Secondary | ICD-10-CM | POA: Diagnosis not present

## 2019-11-13 DIAGNOSIS — U071 COVID-19: Secondary | ICD-10-CM | POA: Diagnosis not present

## 2019-11-13 DIAGNOSIS — J189 Pneumonia, unspecified organism: Secondary | ICD-10-CM

## 2019-11-13 DIAGNOSIS — D649 Anemia, unspecified: Secondary | ICD-10-CM | POA: Diagnosis not present

## 2019-11-13 LAB — CBC WITH DIFFERENTIAL/PLATELET
Abs Immature Granulocytes: 0.03 10*3/uL (ref 0.00–0.07)
Basophils Absolute: 0 10*3/uL (ref 0.0–0.1)
Basophils Relative: 0 %
Eosinophils Absolute: 0 10*3/uL (ref 0.0–0.5)
Eosinophils Relative: 0 %
HCT: 30.3 % — ABNORMAL LOW (ref 36.0–46.0)
Hemoglobin: 8.8 g/dL — ABNORMAL LOW (ref 12.0–15.0)
Immature Granulocytes: 1 %
Lymphocytes Relative: 15 %
Lymphs Abs: 0.8 10*3/uL (ref 0.7–4.0)
MCH: 18.4 pg — ABNORMAL LOW (ref 26.0–34.0)
MCHC: 29 g/dL — ABNORMAL LOW (ref 30.0–36.0)
MCV: 63.5 fL — ABNORMAL LOW (ref 80.0–100.0)
Monocytes Absolute: 0.2 10*3/uL (ref 0.1–1.0)
Monocytes Relative: 4 %
Neutro Abs: 4.4 10*3/uL (ref 1.7–7.7)
Neutrophils Relative %: 80 %
Platelets: 210 10*3/uL (ref 150–400)
RBC: 4.77 MIL/uL (ref 3.87–5.11)
RDW: 28.9 % — ABNORMAL HIGH (ref 11.5–15.5)
WBC: 5.5 10*3/uL (ref 4.0–10.5)
nRBC: 0.7 % — ABNORMAL HIGH (ref 0.0–0.2)

## 2019-11-13 LAB — TYPE AND SCREEN
ABO/RH(D): A POS
Antibody Screen: NEGATIVE
Unit division: 0
Unit division: 0

## 2019-11-13 LAB — COMPREHENSIVE METABOLIC PANEL
ALT: 18 U/L (ref 0–44)
AST: 23 U/L (ref 15–41)
Albumin: 3 g/dL — ABNORMAL LOW (ref 3.5–5.0)
Alkaline Phosphatase: 36 U/L — ABNORMAL LOW (ref 38–126)
Anion gap: 9 (ref 5–15)
BUN: 8 mg/dL (ref 6–20)
CO2: 23 mmol/L (ref 22–32)
Calcium: 8.5 mg/dL — ABNORMAL LOW (ref 8.9–10.3)
Chloride: 104 mmol/L (ref 98–111)
Creatinine, Ser: 0.8 mg/dL (ref 0.44–1.00)
GFR calc Af Amer: >60 mL/min (ref >60)
GFR calc non Af Amer: >60 mL/min (ref >60)
Glucose, Bld: 122 mg/dL — ABNORMAL HIGH (ref 70–99)
Potassium: 3.9 mmol/L (ref 3.5–5.1)
Sodium: 136 mmol/L (ref 135–145)
Total Bilirubin: 0.2 mg/dL — ABNORMAL LOW (ref 0.3–1.2)
Total Protein: 6.7 g/dL (ref 6.5–8.1)

## 2019-11-13 LAB — BPAM RBC
Blood Product Expiration Date: 202109222359
Blood Product Expiration Date: 202109222359
ISSUE DATE / TIME: 202108250233
ISSUE DATE / TIME: 202108250455
Unit Type and Rh: 6200
Unit Type and Rh: 6200

## 2019-11-13 LAB — PHOSPHORUS: Phosphorus: 3.8 mg/dL (ref 2.5–4.6)

## 2019-11-13 LAB — D-DIMER, QUANTITATIVE: D-Dimer, Quant: 1.48 ug/mL-FEU — ABNORMAL HIGH (ref 0.00–0.50)

## 2019-11-13 LAB — FIBRINOGEN: Fibrinogen: 413 mg/dL (ref 210–475)

## 2019-11-13 LAB — C-REACTIVE PROTEIN: CRP: 1.3 mg/dL — ABNORMAL HIGH (ref <1.0)

## 2019-11-13 LAB — FERRITIN: Ferritin: 14 ng/mL (ref 11–307)

## 2019-11-13 LAB — SEDIMENTATION RATE: Sed Rate: 24 mm/hr — ABNORMAL HIGH (ref 0–22)

## 2019-11-13 LAB — LACTATE DEHYDROGENASE: LDH: 214 U/L — ABNORMAL HIGH (ref 98–192)

## 2019-11-13 LAB — MAGNESIUM: Magnesium: 2.2 mg/dL (ref 1.7–2.4)

## 2019-11-13 NOTE — Progress Notes (Signed)
PROGRESS NOTE    Carly Bullock  GLO:[id number] DOB: 1996/02/28 DOA: 11/11/2019 PCP: Patient, No Pcp Per   Brief Narrative:  The patient is a 24 year old female with past medical history significant for anemia who presented to the ED for evaluation of nausea vomiting after recently testing positive for Covid last week.  She reported having cough for several days and feeling weak and tested positive for Covid 11/05/2019.  For last 3 days she started having nausea and vomiting and had not been able to take anything p.o.  She denied any abdominal pain or diarrhea.  She thinks that she had a miscarriage back in February and since then she is continued to have extremely heavy menstrual cycles.  She states that each menstrual cycle last 2 weeks and she has not seen a gynecologist and denies any hematemesis, hematochezia or any melena.  Reports history of having sickle cell trait.  In the ED she is found to be febrile and tachycardic but she is not tachypneic and saturating 100% on room air.  Chest x-ray was done showing patchy airspace opacities in the right lung base concerning for pneumonia.  In the ED she is given Zofran, potassium supplementation given her low potassium level 2.3, 1 L lactated Ringer bolus as well as 2 units of PRBCs.    **Interim History Patient feels better now after the 2 units of PRBCs.  Her O2 saturations are fairly stable on room air and she is not hypoxic.  Anticipating discharging her home in the next 24 to 48 hours if she can get set up with outpatient remdesivir infusion clinic.  Assessment & Plan:   Principal Problem:   Pneumonia due to COVID-19 virus Active Problems:   Sepsis ([place])   Nausea and vomiting   Symptomatic anemia   Menorrhagia   Pneumonia  Sepsis secondary to COVID-19viralpneumonia -Tested positive for Covid 8/18. Complaining of a cough.  -Met Sepsis Criteria as she was Febrile at 101.4 andslightlytachycardic at 119 and Tachypenic at 6 with a  Pneumonia from [place] -Not hypoxic-satting 100% on room air and no evidence of End-organ damage -Chest x-ray showing patchy airspace opacities in the right lung base concerning for pneumonia. Bacterial pneumonia less likely given no leukocytosis on labs. -Received 1 L IV fluid bolus in the ED, order additional 1 L bolus as patient continues to be slightly tachycardic and blood pressure low with systolic around 90. -Started Remdesivir dosing per pharmacy; Day 2/5 Complete today  -C/w IV Solu-Medrol 0.5 mg/kg every 12 hours -Vitamin C, zinc, vitamin D -Antitussives as needed with Tussionex and Robitussin DM  -Tylenol as needed -Inflammatory Marker Trend: Recent Labs    11/12/19 0310 11/12/19 1115 11/13/19 0402  DDIMER 1.06* 1.01* 1.48*  FERRITIN $RemoveB'13 11 14  'vzLZGzlW$ LDH 385* 231* 214*  CRP 1.2* 1.6* 1.3*  -PCT was <0.10 -ESR went from 22 -> 24 -Fibrinogen went from 355 -> 394 -> 413 Lab Results  Component Value Date   SARSCOV2NAA POSITIVE (A) 11/12/2019  -SpO2: 97 % on Room Air -Airborne and contact precautions -Continuous pulse ox -IF needed will start Combivent scheduled  -Supplemental oxygen as needed to keep oxygen saturation above 90% -Blood culture x2 showed NGTD <24 Hours -Repeat CXR in the AM  -Will check an [place] Prior to D/C   Nausea and Emesis -Suspect due to COVID-19 viral gastroenteritis.  -No complaints of abdominal pain and abdominal exam benign.  -Beta hCG negative. Not actively vomiting at this time and able to  drink fluids. -Checked Lipase at 24 and LFTs (normal now but had an elevated AST) -C/w Antiemetics as needed and with supportive care.  Symptomatic blood loss anemia/iron deficiency anemia -Hemoglobin 5.9 and MCV 56.9. No prior labs for comparison.  -Anemia panel showing very low iron of 13, saturation of 3% and low ferritin of 11, UIBC of 359, TIBC of 372.  -Patient reports having heavy menstrual cycles which last 2 weeks at a time  since February. Denies any symptoms of GI bleed. -Type and screen, 2 units PRBCs ordered. Monitor H&H. -Repeat Hgb/Hct was 8.6/30.3 yesterday and today was 8.8/30.3 -Continue to Monitor for S/Sx of Bleeding; Currently no overt bleeding noted -Repeat CBC in the AM  Menorrhagia -Likely contributing to her anemia. -Will need to Ensure gynecology follow-up. Elevated AST -In the setting of Covid 19 disease -Improved now as AST went from 52 -> 26 -> 23  Leukopenia -Patient's WBC went from 3.9 -> 3.6 and is now improved to 5.5  -Continue to Monitor and Trend and Repeat CBC in the AM   Mild Hypokalemia -In setting of intractable nausea and vomiting. Potassium 3.3 on admission and is now 3.9 -Potassium supplementation given. Checked magnesium level and was 1.8 -> 2.0 -> 2.2 -Continue to monitor electrolytes.  Metabolic Acidosis -Mild. CO2 was 21, Chloride Level was 107, and AG was 9; Now CO2 is 23, AG is 9, and Chloride Level is 104 -Continue to Monitor and Trend -Repeat CBC in the AM   Hypophosphatemia -Improved. -Phos Level went from 2.4 -> 3.8 -Replete yesterday -Continue to Monitor and Trend -Repeat Phos Level in the AM   Hyperglycemia -In the setting of Steroid Demargination -Patient's Blood Sugars ranging from 122-196 -Check HbA1c in the AM -CBG's likely to worsen in the setting of Glucocorticoids -If necessary will place on Sensitive Novolog SSI AC  Obesity -Estimated body mass index is 37.58 kg/m as calculated from the following:   Height as of this encounter: $RemoveBeforeD'5\' 6"'XmHcyRNpGDsKOo$  (1.676 m).   Weight as of this encounter: 105.6 kg.  -Continued Weight Loss and Dietary Counseling  DVT prophylaxis: SCDs given recent Anemia; Will consider starting Pharmacologic Prophylaxis in the AM if Hgb/Hct remains stable  Code Status: FULL CODE  Family Communication: No family present at bedside  Disposition Plan: Pending further improvement and completion of Remdesivir; Will attempt to set up  Outpatient Clinic in the AM and anticipate D/C home in the AM if able to make it to [facility] for Completion   Status is: Inpatient  Remains inpatient appropriate because:IV treatments appropriate due to intensity of illness or inability to take PO and Inpatient level of care appropriate due to severity of illness   Dispo: The patient is from: Home              Anticipated d/c is to: Home              Anticipated d/c date is: 1 day              Patient currently is not medically stable to d/c.  Consultants:   None  Procedures: None  Antimicrobials:  Anti-infectives (From admission, onward)   Start     Dose/Rate Route Frequency Ordered Stop   11/13/19 1000  remdesivir 100 mg in sodium chloride 0.9 % 100 mL IVPB       "Followed by" Linked Group Details   100 mg 200 mL/hr over 30 Minutes Intravenous Daily 11/12/19 0314 11/17/19 0959   11/12/19 0415  remdesivir 200 mg in sodium chloride 0.9% 250 mL IVPB       "Followed by" Linked Group Details   200 mg 580 mL/hr over 30 Minutes Intravenous Once 11/12/19 0314 11/12/19 0519     Subjective: Seen and examined at bedside and she states that she is feeling better.  She still has a little bit of a cough.  But denies any shortness of breath.  No chest pain cough.  States that she has not had any more nausea now.  No other concerns or points at this time.  Objective: Vitals:   11/13/19 0300 11/13/19 0330 11/13/19 0421 11/13/19 0750  BP: 113/79 112/73  104/69  Pulse: 83   76  Resp: 16 18    Temp:  98.4 F (36.9 C)  98 F (36.7 C)  TempSrc:  Oral  Oral  SpO2: 99% 97%  97%  Weight:  105.1 kg 105.6 kg   Height:  $Remove'5\' 5"'YWWoLog$  (1.651 m) $RemoveB'5\' 6"'DRpgCbTM$  (1.676 m)    No intake or output data in the 24 hours ending 11/13/19 0838 Filed Weights   11/11/19 2208 11/13/19 0330 11/13/19 0421  Weight: 106.6 kg 105.1 kg 105.6 kg   Examination: Physical Exam:  Constitutional: WN/WD obese African-American female currently in no acute distress appears  calm and does appear comfortable Eyes: Lids and conjunctivae normal, sclerae anicteric  ENMT: External Ears, Nose appear normal. Grossly normal hearing. Neck: Appears normal, supple, no cervical masses, normal ROM, no appreciable thyromegaly; no JVD Respiratory: Diminished to auscultation bilaterally, no wheezing, rales, rhonchi or crackles. Normal respiratory effort and patient is not tachypenic. No accessory muscle use.  Unlabored breathing Cardiovascular: RRR, no murmurs / rubs / gallops. S1 and S2 auscultated.  Abdomen: Soft, non-tender, distended secondary to body habitus. Bowel sounds positive.  GU: Deferred. Musculoskeletal: No clubbing / cyanosis of digits/nails. No joint deformity upper and lower extremities.  Skin: No rashes, lesions, ulcers on limited skin evaluation. No induration; Warm and dry.  Neurologic: CN 2-12 grossly intact with no focal deficits. Romberg sign and cerebellar reflexes not assessed.  Psychiatric: Normal judgment and insight. Alert and oriented x 3. Normal mood and appropriate affect.   Data Reviewed: I have personally reviewed following labs and imaging studies  CBC: Recent Labs  Lab 11/11/19 2213 11/12/19 1115 11/13/19 0402  WBC 3.9* 3.6* 5.5  NEUTROABS  --  2.7 4.4  HGB 5.9* 8.6* 8.8*  HCT 22.6* 30.3* 30.3*  MCV 56.9* 63.5* 63.5*  PLT 192 184 606   Basic Metabolic Panel: Recent Labs  Lab 11/11/19 2213 11/12/19 0310 11/12/19 1115 11/13/19 0402  NA 133*  --  137 136  K 3.3*  --  3.7 3.9  CL 100  --  107 104  CO2 23  --  21* 23  GLUCOSE 108*  --  196* 122*  BUN <5*  --  5* 8  CREATININE 0.86  --  0.76 0.80  CALCIUM 8.6*  --  8.4* 8.5*  MG  --  1.8 2.0 2.2  PHOS  --   --  2.4* 3.8   GFR: Estimated Creatinine Clearance: 130.9 mL/min (by C-G formula based on SCr of 0.8 mg/dL). Liver Function Tests: Recent Labs  Lab 11/12/19 0310 11/12/19 1115 11/13/19 0402  AST 52* 26 23  ALT $Re'21 19 18  'lvw$ ALKPHOS 33* 39 36*  BILITOT 1.1 0.5 0.2*    PROT 7.1 6.9 6.7  ALBUMIN 3.4* 3.1* 3.0*   Recent Labs  Lab 11/12/19 0310  LIPASE  24   No results for input(s): AMMONIA in the last 168 hours. Coagulation Profile: No results for input(s): INR, PROTIME in the last 168 hours. Cardiac Enzymes: No results for input(s): CKTOTAL, CKMB, CKMBINDEX, TROPONINI in the last 168 hours. BNP (last 3 results) No results for input(s): PROBNP in the last 8760 hours. HbA1C: No results for input(s): HGBA1C in the last 72 hours. CBG: No results for input(s): GLUCAP in the last 168 hours. Lipid Profile: No results for input(s): CHOL, HDL, LDLCALC, TRIG, CHOLHDL, LDLDIRECT in the last 72 hours. Thyroid Function Tests: No results for input(s): TSH, T4TOTAL, FREET4, T3FREE, THYROIDAB in the last 72 hours. Anemia Panel: Recent Labs    11/12/19 0046 11/12/19 0310 11/12/19 1115 11/13/19 0402  VITAMINB12 751  --   --   --   FOLATE 28.8  --   --   --   FERRITIN 11   < > 11 14  TIBC 372  --   --   --   IRON 13*  --   --   --   RETICCTPCT 1.1  --   --   --    < > = values in this interval not displayed.   Sepsis Labs: Recent Labs  Lab 11/12/19 0310 11/12/19 0313  PROCALCITON <0.10  --   LATICACIDVEN  --  1.6    Recent Results (from the past 240 hour(s))  SARS Coronavirus 2 by RT PCR (hospital order, performed in [facility] hospital lab) Nasopharyngeal Nasopharyngeal Swab     Status: Abnormal   Collection Time: 11/12/19  2:44 AM   Specimen: Nasopharyngeal Swab  Result Value Ref Range Status   SARS Coronavirus 2 POSITIVE (A) NEGATIVE Final    Comment: RESULT CALLED TO, READ BACK BY AND VERIFIED WITH: T PHILIPS RN 11/12/19 0536 JDW (NOTE) SARS-CoV-2 target nucleic acids are DETECTED  SARS-CoV-2 RNA is generally detectable in upper respiratory specimens  during the acute phase of infection.  Positive results are indicative  of the presence of the identified virus, but do not rule out bacterial infection or co-infection with other  pathogens not detected by the test.  Clinical correlation with patient history and  other diagnostic information is necessary to determine patient infection status.  The expected result is negative.  Fact Sheet for Patients:   StrictlyIdeas.no   Fact Sheet for Healthcare Providers:   BankingDealers.co.za    This test is not yet approved or cleared by the [place] FDA and  has been authorized for detection and/or diagnosis of SARS-CoV-2 by FDA under an Emergency Use Authorization (EUA).  This EUA will remain in effect (meaning this test ca n be used) for the duration of  the COVID-19 declaration under Section 564(b)(1) of the Act, 21 U.S.C. section 360-bbb-3(b)(1), unless the authorization is terminated or revoked sooner.  Performed at [facility], [place] [address], [place], [place] [address]   Culture, blood (Routine X 2) w Reflex to ID Panel     Status: None (Preliminary result)   Collection Time: 11/12/19  4:11 AM   Specimen: BLOOD RIGHT HAND  Result Value Ref Range Status   Specimen Description BLOOD RIGHT HAND  Final   Special Requests   Final    BOTTLES DRAWN AEROBIC AND ANAEROBIC Blood Culture results may not be optimal due to an inadequate volume of blood received in culture bottles   Culture   Final    NO GROWTH 1 DAY Performed at [facility], [place]  [address], [place], [place] [address]    Report Status PENDING  Incomplete  Culture, blood (Routine X 2) w Reflex to ID Panel     Status: None (Preliminary result)   Collection Time: 11/12/19 11:16 AM   Specimen: BLOOD  Result Value Ref Range Status   Specimen Description BLOOD LEFT ANTECUBITAL  Final   Special Requests   Final    BOTTLES DRAWN AEROBIC AND ANAEROBIC Blood Culture adequate volume   Culture   Final    NO GROWTH < 24 HOURS Performed at [facility], [place] [address], [place], [place] [address]    Report Status PENDING  Incomplete     RN  Pressure Injury Documentation:     Estimated body mass index is 37.58 kg/m as calculated from the following:   Height as of this encounter: $RemoveBeforeD'5\' 6"'wuqSQNEZKWVcgQ$  (1.676 m).   Weight as of this encounter: 105.6 kg.  Malnutrition Type:      Malnutrition Characteristics:      Nutrition Interventions:     Radiology Studies: DG Chest Port 1 View  Result Date: 11/11/2019 CLINICAL DATA:  COVID-19 positivity EXAM: PORTABLE CHEST 1 VIEW COMPARISON:  None. FINDINGS: Cardiac shadows within normal limits. Patchy airspace opacities are noted in the right lung base consistent with the given clinical history. No sizable effusion is noted. No bony abnormality is noted. IMPRESSION: Patchy airspace opacities in the right base which would be consistent with the given clinical history. Electronically Signed   By: Inez Catalina M.D.   On: 11/11/2019 23:11   Scheduled Meds:  vitamin C  500 mg Oral Daily   cholecalciferol  1,000 Units Oral Daily   methylPREDNISolone (SOLU-MEDROL) injection  0.5 mg/kg Intravenous Q12H   phosphorus  500 mg Oral BID   zinc sulfate  220 mg Oral Daily   Continuous Infusions:  remdesivir 100 mg in NS 100 mL 100 mg (11/13/19 0836)    LOS: 1 day   Kerney Elbe, DO [facility] PAGER is on [place]  If 7PM-7AM, please contact night-coverage [email or web address]

## 2019-11-13 NOTE — Progress Notes (Signed)
SATURATION QUALIFICATIONS: (This note is used to comply with regulatory documentation for home oxygen)  Patient Saturations on Room Air at Rest = 98%  Patient Saturations on Room Air while Ambulating = 94%   Please briefly explain why patient needs home oxygen: pt does not qualify for home oxygen her saturation did not drop bellow 90% while ambulating on room air

## 2019-11-14 ENCOUNTER — Inpatient Hospital Stay (HOSPITAL_COMMUNITY): Payer: Self-pay

## 2019-11-14 DIAGNOSIS — U071 COVID-19: Secondary | ICD-10-CM | POA: Diagnosis not present

## 2019-11-14 DIAGNOSIS — N920 Excessive and frequent menstruation with regular cycle: Secondary | ICD-10-CM | POA: Diagnosis not present

## 2019-11-14 DIAGNOSIS — D649 Anemia, unspecified: Secondary | ICD-10-CM | POA: Diagnosis not present

## 2019-11-14 DIAGNOSIS — R112 Nausea with vomiting, unspecified: Secondary | ICD-10-CM | POA: Diagnosis not present

## 2019-11-14 DIAGNOSIS — D500 Iron deficiency anemia secondary to blood loss (chronic): Secondary | ICD-10-CM

## 2019-11-14 LAB — COMPREHENSIVE METABOLIC PANEL
ALT: 18 U/L (ref 0–44)
AST: 19 U/L (ref 15–41)
Albumin: 3.1 g/dL — ABNORMAL LOW (ref 3.5–5.0)
Alkaline Phosphatase: 41 U/L (ref 38–126)
Anion gap: 10 (ref 5–15)
BUN: 10 mg/dL (ref 6–20)
CO2: 24 mmol/L (ref 22–32)
Calcium: 9 mg/dL (ref 8.9–10.3)
Chloride: 106 mmol/L (ref 98–111)
Creatinine, Ser: 0.71 mg/dL (ref 0.44–1.00)
GFR calc Af Amer: >60 mL/min (ref >60)
GFR calc non Af Amer: >60 mL/min (ref >60)
Glucose, Bld: 117 mg/dL — ABNORMAL HIGH (ref 70–99)
Potassium: 3.8 mmol/L (ref 3.5–5.1)
Sodium: 140 mmol/L (ref 135–145)
Total Bilirubin: 0.3 mg/dL (ref 0.3–1.2)
Total Protein: 7 g/dL (ref 6.5–8.1)

## 2019-11-14 LAB — CBC WITH DIFFERENTIAL/PLATELET
Abs Immature Granulocytes: 0.09 10*3/uL — ABNORMAL HIGH (ref 0.00–0.07)
Basophils Absolute: 0 10*3/uL (ref 0.0–0.1)
Basophils Relative: 0 %
Eosinophils Absolute: 0 10*3/uL (ref 0.0–0.5)
Eosinophils Relative: 0 %
HCT: 32.8 % — ABNORMAL LOW (ref 36.0–46.0)
Hemoglobin: 9.5 g/dL — ABNORMAL LOW (ref 12.0–15.0)
Immature Granulocytes: 1 %
Lymphocytes Relative: 9 %
Lymphs Abs: 1 10*3/uL (ref 0.7–4.0)
MCH: 18.4 pg — ABNORMAL LOW (ref 26.0–34.0)
MCHC: 29 g/dL — ABNORMAL LOW (ref 30.0–36.0)
MCV: 63.7 fL — ABNORMAL LOW (ref 80.0–100.0)
Monocytes Absolute: 0.5 10*3/uL (ref 0.1–1.0)
Monocytes Relative: 5 %
Neutro Abs: 9.6 10*3/uL — ABNORMAL HIGH (ref 1.7–7.7)
Neutrophils Relative %: 85 %
Platelets: 273 10*3/uL (ref 150–400)
RBC: 5.15 MIL/uL — ABNORMAL HIGH (ref 3.87–5.11)
RDW: 29.8 % — ABNORMAL HIGH (ref 11.5–15.5)
WBC: 11.2 10*3/uL — ABNORMAL HIGH (ref 4.0–10.5)
nRBC: 0.5 % — ABNORMAL HIGH (ref 0.0–0.2)

## 2019-11-14 LAB — PHOSPHORUS: Phosphorus: 4.4 mg/dL (ref 2.5–4.6)

## 2019-11-14 LAB — SEDIMENTATION RATE: Sed Rate: 20 mm/hr (ref 0–22)

## 2019-11-14 LAB — MAGNESIUM: Magnesium: 2.2 mg/dL (ref 1.7–2.4)

## 2019-11-14 LAB — FIBRINOGEN: Fibrinogen: 388 mg/dL (ref 210–475)

## 2019-11-14 LAB — C-REACTIVE PROTEIN: CRP: 0.9 mg/dL (ref <1.0)

## 2019-11-14 LAB — D-DIMER, QUANTITATIVE: D-Dimer, Quant: 1.01 ug/mL-FEU — ABNORMAL HIGH (ref 0.00–0.50)

## 2019-11-14 LAB — LACTATE DEHYDROGENASE: LDH: 203 U/L — ABNORMAL HIGH (ref 98–192)

## 2019-11-14 LAB — FERRITIN: Ferritin: 15 ng/mL (ref 11–307)

## 2019-11-14 MED ORDER — ZINC SULFATE 220 (50 ZN) MG PO CAPS: 220.0000 mg | ORAL_CAPSULE | Freq: Every day | ORAL | 0 refills | Status: DC

## 2019-11-14 MED ORDER — VITAMIN D3 25 MCG PO TABS: 1000.0000 [IU] | ORAL_TABLET | Freq: Every day | ORAL | 0 refills | Status: DC

## 2019-11-14 MED ORDER — ASCORBIC ACID 500 MG PO TABS
500.0000 mg | ORAL_TABLET | Freq: Every day | ORAL | 0 refills | Status: DC
Start: 2019-11-15 — End: 2021-06-21

## 2019-11-14 MED ORDER — GUAIFENESIN-DM 100-10 MG/5ML PO SYRP: 10.0000 mL | ORAL_SOLUTION | ORAL | 0 refills | Status: DC | PRN

## 2019-11-14 MED ORDER — DEXAMETHASONE 6 MG PO TABS: 6.0000 mg | ORAL_TABLET | Freq: Every day | ORAL | 0 refills | Status: DC

## 2019-11-14 MED ORDER — VITAMIN D3 25 MCG PO TABS
1000.0000 [IU] | ORAL_TABLET | Freq: Every day | ORAL | 0 refills | Status: DC
Start: 2019-11-15 — End: 2020-03-31

## 2019-11-14 MED ORDER — ASCORBIC ACID 500 MG PO TABS
500.0000 mg | ORAL_TABLET | Freq: Every day | ORAL | 0 refills | Status: DC
Start: 2019-11-15 — End: 2019-11-14

## 2019-11-14 MED ORDER — DEXAMETHASONE 6 MG PO TABS: 6.0000 mg | ORAL_TABLET | Freq: Every day | ORAL | 0 refills | Status: AC

## 2019-11-14 MED FILL — ZINC SULFATE 220 MG TABLET: 220 (50 ZN) | 30 days supply | Qty: 30 | Fill #0

## 2019-11-14 MED FILL — SM TUSSIN DM SYRUP: 100-10 | 6 days supply | Qty: 236 | Fill #0

## 2019-11-14 MED FILL — DEXAMETHASONE 6 MG TABLET: 6 | 6 days supply | Qty: 6 | Fill #0

## 2019-11-14 MED FILL — VITAMIN C 500 MG TABLET: 500 | 30 days supply | Qty: 30 | Fill #0

## 2019-11-14 MED FILL — VITAMIN D3 25 MCG TABS: 25 | 30 days supply | Qty: 30 | Fill #0

## 2019-11-14 NOTE — Progress Notes (Signed)
Pt alert and oriented x4. Discharge paperwork reviewed with teachback. Pt verbalized understanding. Pt discharged by hospital and rolled down to main lobby when her transport arrived. Pt taken down by NT. Pt's belongings and discharge paperwork, along with medications taken with pt.

## 2019-11-14 NOTE — TOC Initial Note (Signed)
Transition of Care [facility]) - Initial/Assessment Note    Patient Details  Name: Carly Bullock MRN: [id number] Date of Birth: 1996-02-09  [facility]) CM/SW Contact:    Lockie Pares, RN Phone Number: 11/14/2019, 9:18 AM  Clinical Narrative:                 Patient admitted with COVID Pneumonia patient uninsured on room air at this time, oxygen qualifications reveal no need for oxygen. . Will likely discharge home self care. Will follow for needs, referral to financial planning and COVID clinic post discharge.   Expected Discharge Plan: Home/Self Care Barriers to Discharge: Continued Medical Work up   Patient Goals and CMS Choice        Expected Discharge Plan and Services Expected Discharge Plan: Home/Self Care       Living arrangements for the past 2 months: Apartment                                      Prior Living Arrangements/Services Living arrangements for the past 2 months: Apartment Lives with:: Self Patient language and need for interpreter reviewed:: Yes        Need for Family Participation in Patient Care: Yes (Comment) Care giver support system in place?: Yes (comment)   Criminal Activity/Legal Involvement Pertinent to Current Situation/Hospitalization: No - Comment as needed  Activities of Daily Living Home Assistive Devices/Equipment: Eyeglasses ADL Screening (condition at time of admission) Patient's cognitive ability adequate to safely complete daily activities?: Yes Is the patient deaf or have difficulty hearing?: No Does the patient have difficulty seeing, even when wearing glasses/contacts?: No Does the patient have difficulty concentrating, remembering, or making decisions?: No Patient able to express need for assistance with ADLs?: Yes Does the patient have difficulty dressing or bathing?: No Independently performs ADLs?: Yes (appropriate for developmental age) Does the patient have difficulty walking or climbing stairs?:  No Weakness of Legs: None Weakness of Arms/Hands: None  Permission Sought/Granted                  Emotional Assessment       Orientation: : Oriented to Situation, Oriented to  Time, Oriented to Place, Oriented to Self Alcohol / Substance Use: Not Applicable Psych Involvement: No (comment)  Admission diagnosis:  Hypokalemia [E87.6] Hyponatremia [E87.1] Pneumonia [J18.9] Symptomatic anemia [D64.9] Non-intractable vomiting with nausea, unspecified vomiting type [R11.2] COVID-19 virus infection [U07.1] COVID-19 [U07.1] Patient Active Problem List   Diagnosis Date Noted  . Pneumonia due to COVID-19 virus 11/12/2019  . Sepsis (HCC) 11/12/2019  . Nausea and vomiting 11/12/2019  . Symptomatic anemia 11/12/2019  . Menorrhagia 11/12/2019  . Pneumonia 11/12/2019   PCP:  Patient, No Pcp Per Pharmacy:  No Pharmacies Listed    Social Determinants of Health (SDOH) Interventions    Readmission Risk Interventions No flowsheet data found.

## 2019-11-14 NOTE — TOC Progression Note (Signed)
Transition of Care [facility]) - Progression Note    Patient Details  Name: Carrington Mullenax MRN: [id number] Date of Birth: [birth year]  [facility] CM/SW Contact  Lockie Pares, RN Phone Number: 11/14/2019, 9:52 AM  Clinical Narrative:     Appointment for follow up made a t the COVID clinic. It is listed in patient instructions and will be printed out for patient at discharge.  Expected Discharge Plan: Home/Self Care Barriers to Discharge: Continued Medical Work up  Expected Discharge Plan and Services Expected Discharge Plan: Home/Self Care       Living arrangements for the past 2 months: Apartment                                       Social Determinants of Health (SDOH) Interventions    Readmission Risk Interventions No flowsheet data found.

## 2019-11-14 NOTE — Progress Notes (Signed)
Patient scheduled for outpatient Remdesivir infusions at 9am on Saturday 8/28 and Sunday 8/29 at [facility]. Please inform the patient to park at [address], [place], as staff will be escorting the patient through the east entrance of the hospital.    There is a wave flag banner located near the entrance on [address] Turn into this entrance and immediately turn left and park in 1 of the 5 designated Covid Infusion Parking spots. There is a phone number on the sign, please call and let the staff know what spot you are in and we will come out and get you. For questions call [phone].  Thanks.  

## 2019-11-14 NOTE — Discharge Summary (Addendum)
Physician Discharge Summary  Carly Bullock DDU:[id number] DOB: [birth year] DOA: 11/11/2019  PCP: Patient, No Pcp Per  Admit date: 11/11/2019 Discharge date: 11/14/2019  Admitted From: Home Disposition:  Home  Recommendations for Outpatient Follow-up:  1. Follow up and establish with PCP in 1-2 weeks; Has a post Covid Care Appointment on 11/27/2019 at 0940 2. Remdesivir infusions at 9am on Saturday 8/28 and Sunday 8/29 at [facility]; There is a wave flag banner located near the entrance on [address]. Turn into this entranceand immediatelyturn left and park in 1 of the 5 designated Covid Infusion Parking spots. There is a phone number on the sign, please call and let the staff know what spot you are in and we will come out and get you. For questions call [phone]. 3. Follow up and establish with OB-GYN; Referral has been made and they will call to sch 4. Please obtain CMP/CBC, Mag, Phos in one week 5. Please follow up on the following pending results: Hemoglobin A1c  Home Health: No  Equipment/Devices: None  Discharge Condition: Stable CODE STATUS: FULL CODE  Diet recommendation: Regular  Brief/Interim Summary: The patient is a 24 year old female with past medical history significant for anemia who presented to the ED for evaluation of nausea vomiting after recently testing positive for Covid last week. She reported having cough for several days and feeling weak and tested positive for Covid 11/05/2019. For last 3 days she started having nausea and vomiting and had not been able to take anything p.o. She denied any abdominal pain or diarrhea. She thinks that she had a miscarriage back in February and since then she is continued to have extremely heavy menstrual cycles. She states that each menstrual cycle last 2 weeks and she has not seen a gynecologist and denies any hematemesis, hematochezia or any melena. Reports history of having sickle cell trait. In the ED she is found  to be febrile and tachycardic but she is not tachypneic and saturating 100% on room air. Chest x-ray was done showing patchy airspace opacities in the right lung base concerning for pneumonia. In the ED she is given Zofran, potassium supplementation given her low potassium level 2.3, 1 L lactated Ringer bolus as well as 2 units of PRBCs.   **Interim History Patient feels better now after the 2 units of PRBCs.  Her O2 saturations are fairly stable on room air and she is not hypoxic.  Anticipating discharging her home in the next 24 to 48 hours if she can get set up with outpatient remdesivir infusion clinic.  Discharge Diagnoses:  Principal Problem:   Pneumonia due to COVID-19 virus Active Problems:   Sepsis ([place])   Nausea and vomiting   Symptomatic anemia   Menorrhagia   Pneumonia  Sepsis secondary to COVID-19viralpneumonia -Tested positive for Covid 8/18. Complaining of a cough. -Met Sepsis Criteria as she wasFebrileat 101.4andslightlytachycardicat 119 and Tachypenic at 82 with a Pneumonia from [place] -Not hypoxic-satting 100% on room airand no evidence of End-organ damage -Chest x-ray showing patchy airspace opacities in the right lung base concerning for pneumonia. Bacterial pneumonia less likely given no leukocytosis on labs. -Received 1 L IV fluid bolus in the ED, order additional 1 L bolus as patient continues to be slightly tachycardic and blood pressure low with systolic around 90. -StartedRemdesivir dosing per pharmacy; Day 3/5 Complete today; a referral has been made to the remdesivir outpatient infusion clinic and she has a post Covid follow-up on 11/27/2019 at 9:40 AM -C/w IV  Solu-Medrol 0.5 mg/kg every 12 hours -Vitamin C, zinc, vitamin D -Antitussives as neededwith Tussionex and Robitussin DM -Tylenol as needed -Inflammatory Marker Trend:  Recent Labs    11/12/19 1115 11/13/19 0402 11/14/19 0312  DDIMER 1.01* 1.48* 1.01*  FERRITIN $RemoveB'11 14 15  'uPCozmRX$ LDH 231*  214* 203*  CRP 1.6* 1.3* 0.9  -PCT was <0.10 -ESR went from 22 -> 24 -Fibrinogen went from 355 -> 394 -> 413  Lab Results  Component Value Date   SARSCOV2NAA POSITIVE (A) 11/12/2019  -SpO2: 99 % on Room Air -Airborne and contact precautions -Continuous pulse ox -IF needed will start Combivent scheduled  -Supplemental oxygen as needed to keep oxygen saturation above 90% -Blood culture x2 showed NGTD at 2 Days -Repeat CXR this AM showed "Low lung volumes. Persistent patchy infiltrate right lung base with interim improvement from prior exam." -Will check an Ambulatory Home O2 Screen Prior to D/C: did not desaturate and does not require any supplemental oxygen for home -Follow-up with post Covid care clinic as well with PCP  Nausea andEmesis -Suspect due to COVID-19 viral gastroenteritis. -No complaints of abdominal pain and abdominal exam benign. -Beta hCG negative. Not actively vomiting at this time and able to drink fluids. -Checked Lipaseat 24and LFTs (normal now but had an elevated AST) -C/wAntiemeticsas needed and with supportive care. -No longer Nauseous   Symptomatic blood loss anemia/iron deficiency anemia -Hemoglobin 5.9 and MCV 56.9. No prior labs for comparison but suspect chronic give low MCV and Hx of Menorrhagia. -Anemia panel showing very low ironof 13,saturation of 3% and low ferritin of 11, UIBC of 359, TIBC of 372. -Patient reports having heavy menstrual cycles which last 2 weeks at a time since February. Denies any symptoms of GI bleed. -Type and screen, 2 units PRBCs ordered. Monitor H&H. -Repeat Hgb/Hct is much improved 9.5/32.8 -Continue to Monitor for S/Sx of Bleeding; Currently no overt bleeding noted -Repeat CBC in the AM  Menorrhagia -Likely contributing to her anemia. -Will need to Ensure gynecology follow-up.  I spoke to the on-call OB/GYN who will make a referral for an outpatient appointment  Elevated AST -In the setting of  Covid 19 disease -Improved now as AST went from 52 -> 26 -> 23 -> 19 -Follow CMP while getting Remdesivir  Leukopenia -> Leukocytosis -Patient's WBC went from 3.9 -> 3.6 and is now improved to 5.5 and today is 11.2 in the setting of Steroid Demargination -Continue to Monitor and Trend and Repeat CBC in the AM  MildHypokalemia -In setting of intractable nausea and vomiting. Potassium is now 3.8 -Potassium supplementation given. Checkedmagnesium level and was 1.8 ->2.0 -> 2.2 x2 -Continue to monitor electrolytes.  Metabolic Acidosis -Mild. CO2 was 21, Chloride Level was 107, and AG was 9; Now CO2 is 24, AG is 10, and Chloride Level is 106 -Continue to Monitor and Trend -Repeat CBC in the AM  Hypophosphatemia -Improved. -Phos Level went from 2.4 -> 3.8 -> 4.4 -Continue to Monitor and Trend -Repeat Phos Level in the AM   Hyperglycemia -In the setting of Steroid Demargination -Patient's Blood Sugars ranging from 117-196 -Check HbA1c in the AM and STILL PENDING -CBG's likely to worsen in the setting of Glucocorticoids -If necessary will place on Sensitive Novolog SSI AC  Obesity -Estimated body mass index is 37.58 kg/m as calculated from the following:   Height as of this encounter: $RemoveBeforeD'5\' 6"'SxDvZfFNZhvZtZ$  (1.676 m).   Weight as of this encounter: 105.6 kg.  -Continued Weight Loss and Dietary Counseling  Discharge Instructions  Discharge Instructions    Call MD for:  difficulty breathing, headache or visual disturbances   Complete by: As directed    Call MD for:  extreme fatigue   Complete by: As directed    Call MD for:  hives   Complete by: As directed    Call MD for:  persistant dizziness or light-headedness   Complete by: As directed    Call MD for:  persistant nausea and vomiting   Complete by: As directed    Call MD for:  redness, tenderness, or signs of infection (pain, swelling, redness, odor or green/yellow discharge around incision site)   Complete by: As directed     Call MD for:  severe uncontrolled pain   Complete by: As directed    Call MD for:  temperature >100.4   Complete by: As directed    Diet - low sodium heart healthy   Complete by: As directed    Discharge instructions   Complete by: As directed    You were cared for by a hospitalist during your hospital stay. If you have any questions about your discharge medications or the care you received while you were in the hospital after you are discharged, you can call the unit and ask to speak with the hospitalist on call if the hospitalist that took care of you is not available. Once you are discharged, your primary care physician will handle any further medical issues. Please note that NO REFILLS for any discharge medications will be authorized once you are discharged, as it is imperative that you return to your primary care physician (or establish a relationship with a primary care physician if you do not have one) for your aftercare needs so that they can reassess your need for medications and monitor your lab values.  Follow up with PCP and establish with a OB-GYN. OB-GYN office will call to schedule you an appointment. Take all medications as prescribed. If symptoms change or worsen please return to the ED for evaluation   Infection Prevention Recommendations for Individuals Confirmed to have, or Being Evaluated for, 2019 Novel Coronavirus (COVID-19) Infection Who Receive Care at Home  Individuals who are confirmed to have, or are being evaluated for, COVID-19 should follow the prevention steps below until a healthcare provider or local or state health department says they can return to normal activities.  Stay home except to get medical care You should restrict activities outside your home, except for getting medical care. Do not go to work, school, or public areas, and do not use public transportation or taxis.  Call ahead before visiting your doctor Before your medical appointment, call the  healthcare provider and tell them that you have, or are being evaluated for, COVID-19 infection. This will help the healthcare provider's office take steps to keep other people from getting infected. Ask your healthcare provider to call the local or state health department.  Monitor your symptoms Seek prompt medical attention if your illness is worsening (e.g., difficulty breathing). Before going to your medical appointment, call the healthcare provider and tell them that you have, or are being evaluated for, COVID-19 infection. Ask your healthcare provider to call the local or state health department.  Wear a facemask You should wear a facemask that covers your nose and mouth when you are in the same room with other people and when you visit a healthcare provider. People who live with or visit you should also wear a facemask while they are  in the same room with you.  Separate yourself from other people in your home As much as possible, you should stay in a different room from other people in your home. Also, you should use a separate bathroom, if available.  Avoid sharing household items You should not share dishes, drinking glasses, cups, eating utensils, towels, bedding, or other items with other people in your home. After using these items, you should wash them thoroughly with soap and water.  Cover your coughs and sneezes Cover your mouth and nose with a tissue when you cough or sneeze, or you can cough or sneeze into your sleeve. Throw used tissues in a lined trash can, and immediately wash your hands with soap and water for at least 20 seconds or use an alcohol-based hand rub.  Wash your [facility] your hands often and thoroughly with soap and water for at least 20 seconds. You can use an alcohol-based hand sanitizer if soap and water are not available and if your hands are not visibly dirty. Avoid touching your eyes, nose, and mouth with unwashed hands.   Prevention Steps for  Caregivers and Household Members of Individuals Confirmed to have, or Being Evaluated for, COVID-19 Infection Being Cared for in the Home  If you live with, or provide care at home for, a person confirmed to have, or being evaluated for, COVID-19 infection please follow these guidelines to prevent infection:  Follow healthcare provider's instructions Make sure that you understand and can help the patient follow any healthcare provider instructions for all care.  Provide for the patient's basic needs You should help the patient with basic needs in the home and provide support for getting groceries, prescriptions, and other personal needs.  Monitor the patient's symptoms If they are getting sicker, call his or her medical provider and tell them that the patient has, or is being evaluated for, COVID-19 infection. This will help the healthcare provider's office take steps to keep other people from getting infected. Ask the healthcare provider to call the local or state health department.  Limit the number of people who have contact with the patient If possible, have only one caregiver for the patient. Other household members should stay in another home or place of residence. If this is not possible, they should stay in another room, or be separated from the patient as much as possible. Use a separate bathroom, if available. Restrict visitors who do not have an essential need to be in the home.  Keep older adults, very young children, and other sick people away from the patient Keep older adults, very young children, and those who have compromised immune systems or chronic health conditions away from the patient. This includes people with chronic heart, lung, or kidney conditions, diabetes, and cancer.  Ensure good ventilation Make sure that shared spaces in the home have good air flow, such as from an air conditioner or an opened window, weather permitting.  Wash your hands often Wash  your hands often and thoroughly with soap and water for at least 20 seconds. You can use an alcohol based hand sanitizer if soap and water are not available and if your hands are not visibly dirty. Avoid touching your eyes, nose, and mouth with unwashed hands. Use disposable paper towels to dry your hands. If not available, use dedicated cloth towels and replace them when they become wet.  Wear a facemask and gloves Wear a disposable facemask at all times in the room and gloves when you  touch or have contact with the patient's blood, body fluids, and/or secretions or excretions, such as sweat, saliva, sputum, nasal mucus, vomit, urine, or feces.  Ensure the mask fits over your nose and mouth tightly, and do not touch it during use. Throw out disposable facemasks and gloves after using them. Do not reuse. Wash your hands immediately after removing your facemask and gloves. If your personal clothing becomes contaminated, carefully remove clothing and launder. Wash your hands after handling contaminated clothing. Place all used disposable facemasks, gloves, and other waste in a lined container before disposing them with other household waste. Remove gloves and wash your hands immediately after handling these items.  Do not share dishes, glasses, or other household items with the patient Avoid sharing household items. You should not share dishes, drinking glasses, cups, eating utensils, towels, bedding, or other items with a patient who is confirmed to have, or being evaluated for, COVID-19 infection. After the person uses these items, you should wash them thoroughly with soap and water.  Wash laundry thoroughly Immediately remove and wash clothes or bedding that have blood, body fluids, and/or secretions or excretions, such as sweat, saliva, sputum, nasal mucus, vomit, urine, or feces, on them. Wear gloves when handling laundry from the patient. Read and follow directions on labels of laundry or  clothing items and detergent. In general, wash and dry with the warmest temperatures recommended on the label.  Clean all areas the individual has used often Clean all touchable surfaces, such as counters, tabletops, doorknobs, bathroom fixtures, toilets, phones, keyboards, tablets, and bedside tables, every day. Also, clean any surfaces that may have blood, body fluids, and/or secretions or excretions on them. Wear gloves when cleaning surfaces the patient has come in contact with. Use a diluted bleach solution (e.g., dilute bleach with 1 part bleach and 10 parts water) or a household disinfectant with a label that says EPA-registered for coronaviruses. To make a bleach solution at home, add 1 tablespoon of bleach to 1 quart (4 cups) of water. For a larger supply, add  cup of bleach to 1 gallon (16 cups) of water. Read labels of cleaning products and follow recommendations provided on product labels. Labels contain instructions for safe and effective use of the cleaning product including precautions you should take when applying the product, such as wearing gloves or eye protection and making sure you have good ventilation during use of the product. Remove gloves and wash hands immediately after cleaning.  Monitor yourself for signs and symptoms of illness Caregivers and household members are considered close contacts, should monitor their health, and will be asked to limit movement outside of the home to the extent possible. Follow the monitoring steps for close contacts listed on the symptom monitoring form.   ? If you have additional questions, contact your local health department or call the epidemiologist on call at [phone] (available 24/7). ? This guidance is subject to change. For the most up-to-date guidance from [facility], please refer to their website: YouBlogs.pl   Increase activity slowly   Complete by: As directed       Allergies as of 11/14/2019   No Known Allergies     Medication List    TAKE these medications   ascorbic acid 500 MG tablet Commonly known as: VITAMIN C Take 1 tablet (500 mg total) by mouth daily. Start taking on: November 15, 2019   dexamethasone 6 MG tablet Commonly known as: DECADRON Take 1 tablet (6 mg total) by mouth daily for  6 days.   guaiFENesin-dextromethorphan 100-10 MG/5ML syrup Commonly known as: ROBITUSSIN DM Take 10 mLs by mouth every 4 (four) hours as needed for cough.   Vitamin D3 25 MCG tablet Commonly known as: Vitamin D Take 1 tablet (1,000 Units total) by mouth daily. Start taking on: November 15, 2019   zinc sulfate 220 (50 Zn) MG capsule Take 1 capsule (220 mg total) by mouth daily. Start taking on: November 15, 2019       Follow-up Information    [facility] [place] Go on 11/27/2019.   Why: 0940 am Contact information: [address] [place] [address] [phone]             No Known Allergies  Consultations:  Discussed with OB-GYN for follow up appointment  Procedures/Studies: DG CHEST PORT 1 VIEW  Result Date: 11/14/2019 CLINICAL DATA:  Shortness of breath.  COVID positive. EXAM: PORTABLE CHEST 1 VIEW COMPARISON:  11/11/2019. FINDINGS: Mediastinum and hilar structures are unremarkable. Heart size stable low lung volumes with bibasilar atelectasis. Patchy infiltrate right lung base again noted with interim improvement from prior exam. No pleural effusion or pneumothorax. Mild thoracic spine scoliosis. IMPRESSION: Low lung volumes. Persistent patchy infiltrate right lung base with interim improvement from prior exam. Electronically Signed   By: [place]   On: 11/14/2019 05:29   DG Chest Port 1 View  Result Date: 11/11/2019 CLINICAL DATA:  COVID-19 positivity EXAM: PORTABLE CHEST 1 VIEW COMPARISON:  None. FINDINGS: Cardiac shadows within normal limits. Patchy airspace opacities are noted in the  right lung base consistent with the given clinical history. No sizable effusion is noted. No bony abnormality is noted. IMPRESSION: Patchy airspace opacities in the right base which would be consistent with the given clinical history. Electronically Signed   By: Inez Catalina M.D.   On: 11/11/2019 23:11     Subjective: Seen and examined at bedside and she is doing well today with no complaints.  No nausea or vomiting.  Felt well and did not desaturate and not requiring any oxygen he was not short of breath.  Ambulated without issues.  Stable to be discharged home and will need to follow-up with PCP and OB/GYN in the outpatient setting.  Discharge Exam: Vitals:   11/14/19 0410 11/14/19 0839  BP: 122/78 112/68  Pulse: 70   Resp: 18 19  Temp: 98.1 F (36.7 C) 98.2 F (36.8 C)  SpO2: 97% 99%   Vitals:   11/13/19 2010 11/14/19 0000 11/14/19 0410 11/14/19 0839  BP: (!) 90/55 104/71 122/78 112/68  Pulse: 91 73 70   Resp: $Remo'19 19 18 19  'aWRCm$ Temp: 97.6 F (36.4 C) 98.4 F (36.9 C) 98.1 F (36.7 C) 98.2 F (36.8 C)  TempSrc: Oral Oral Oral Oral  SpO2: 94% 97% 97% 99%  Weight:      Height:       General: Pt is alert, awake, not in acute distress Cardiovascular: RRR, S1/S2 +, no rubs, no gallops Respiratory: Diminished bilaterally with coarse breath sounds, no wheezing, no rhonchi Abdominal: Soft, NT, Distended 2/2 body habitus, bowel sounds + Extremities: no edema, no cyanosis  The results of significant diagnostics from this hospitalization (including imaging, microbiology, ancillary and laboratory) are listed below for reference.    Microbiology: Recent Results (from the past 240 hour(s))  SARS Coronavirus 2 by RT PCR (hospital order, performed in [facility] hospital lab) Nasopharyngeal Nasopharyngeal Swab     Status: Abnormal   Collection Time: 11/12/19  2:44 AM   Specimen: Nasopharyngeal Swab  Result Value Ref Range Status   SARS Coronavirus 2 POSITIVE (A) NEGATIVE Final    Comment:  RESULT CALLED TO, READ BACK BY AND VERIFIED WITH: T PHILIPS RN 11/12/19 0536 JDW (NOTE) SARS-CoV-2 target nucleic acids are DETECTED  SARS-CoV-2 RNA is generally detectable in upper respiratory specimens  during the acute phase of infection.  Positive results are indicative  of the presence of the identified virus, but do not rule out bacterial infection or co-infection with other pathogens not detected by the test.  Clinical correlation with patient history and  other diagnostic information is necessary to determine patient infection status.  The expected result is negative.  Fact Sheet for Patients:   StrictlyIdeas.no   Fact Sheet for Healthcare Providers:   BankingDealers.co.za    This test is not yet approved or cleared by the [place] FDA and  has been authorized for detection and/or diagnosis of SARS-CoV-2 by FDA under an Emergency Use Authorization (EUA).  This EUA will remain in effect (meaning this test ca n be used) for the duration of  the COVID-19 declaration under Section 564(b)(1) of the Act, 21 U.S.C. section 360-bbb-3(b)(1), unless the authorization is terminated or revoked sooner.  Performed at [facility], [place] [address] [place], [place] [address]   Culture, blood (Routine X 2) w Reflex to ID Panel     Status: None (Preliminary result)   Collection Time: 11/12/19  4:11 AM   Specimen: BLOOD RIGHT HAND  Result Value Ref Range Status   Specimen Description BLOOD RIGHT HAND  Final   Special Requests   Final    BOTTLES DRAWN AEROBIC AND ANAEROBIC Blood Culture results may not be optimal due to an inadequate volume of blood received in culture bottles   Culture   Final    NO GROWTH 2 DAYS Performed at [facility], [place] [address], [place], [place] [address]    Report Status PENDING  Incomplete  Culture, blood (Routine X 2) w Reflex to ID Panel     Status: None (Preliminary result)   Collection Time:  11/12/19 11:16 AM   Specimen: BLOOD  Result Value Ref Range Status   Specimen Description BLOOD LEFT ANTECUBITAL  Final   Special Requests   Final    BOTTLES DRAWN AEROBIC AND ANAEROBIC Blood Culture adequate volume   Culture   Final    NO GROWTH 2 DAYS Performed at [facility], [place] [address], [place], [place] [address]    Report Status PENDING  Incomplete     Labs: BNP (last 3 results) No results for input(s): BNP in the last 8760 hours. Basic Metabolic Panel: Recent Labs  Lab 11/11/19 2213 11/12/19 0310 11/12/19 1115 11/13/19 0402 11/14/19 0312  NA 133*  --  137 136 140  K 3.3*  --  3.7 3.9 3.8  CL 100  --  107 104 106  CO2 23  --  21* 23 24  GLUCOSE 108*  --  196* 122* 117*  BUN <5*  --  5* 8 10  CREATININE 0.86  --  0.76 0.80 0.71  CALCIUM 8.6*  --  8.4* 8.5* 9.0  MG  --  1.8 2.0 2.2 2.2  PHOS  --   --  2.4* 3.8 4.4   Liver Function Tests: Recent Labs  Lab 11/12/19 0310 11/12/19 1115 11/13/19 0402 11/14/19 0312  AST 52* $Remov'26 23 19  'rdkxgP$ ALT $Remo'21 19 18 'rNciK$ 18  ALKPHOS 33* 39 36* 41  BILITOT 1.1 0.5 0.2* 0.3  PROT 7.1 6.9 6.7 7.0  ALBUMIN 3.4* 3.1* 3.0* 3.1*   Recent Labs  Lab 11/12/19 0310  LIPASE 24   No results for input(s): AMMONIA in the last 168 hours. CBC: Recent Labs  Lab 11/11/19 2213 11/12/19 1115 11/13/19 0402 11/14/19 0312  WBC 3.9* 3.6* 5.5 11.2*  NEUTROABS  --  2.7 4.4 9.6*  HGB 5.9* 8.6* 8.8* 9.5*  HCT 22.6* 30.3* 30.3* 32.8*  MCV 56.9* 63.5* 63.5* 63.7*  PLT 192 184 210 273   Cardiac Enzymes: No results for input(s): CKTOTAL, CKMB, CKMBINDEX, TROPONINI in the last 168 hours. BNP: Invalid input(s): POCBNP CBG: No results for input(s): GLUCAP in the last 168 hours. D-Dimer Recent Labs    11/13/19 0402 11/14/19 0312  DDIMER 1.48* 1.01*   Hgb A1c No results for input(s): HGBA1C in the last 72 hours. Lipid Profile No results for input(s): CHOL, HDL, LDLCALC, TRIG, CHOLHDL, LDLDIRECT in the last 72 hours. Thyroid function  studies No results for input(s): TSH, T4TOTAL, T3FREE, THYROIDAB in the last 72 hours.  Invalid input(s): FREET3 Anemia work up [employer]    11/12/19 0046 11/12/19 0310 11/13/19 0402 11/14/19 0312  VITAMINB12 751  --   --   --   FOLATE 28.8  --   --   --   FERRITIN 11   < > 14 15  TIBC 372  --   --   --   IRON 13*  --   --   --   RETICCTPCT 1.1  --   --   --    < > = values in this interval not displayed.   Urinalysis No results found for: COLORURINE, APPEARANCEUR, [place], [place], [place], [place], [place], [place], PROTEINUR, UROBILINOGEN, NITRITE, LEUKOCYTESUR Sepsis Labs Invalid input(s): PROCALCITONIN,  WBC,  LACTICIDVEN Microbiology Recent Results (from the past 240 hour(s))  SARS Coronavirus 2 by RT PCR (hospital order, performed in [facility] hospital lab) Nasopharyngeal Nasopharyngeal Swab     Status: Abnormal   Collection Time: 11/12/19  2:44 AM   Specimen: Nasopharyngeal Swab  Result Value Ref Range Status   SARS Coronavirus 2 POSITIVE (A) NEGATIVE Final    Comment: RESULT CALLED TO, READ BACK BY AND VERIFIED WITH: T PHILIPS RN 11/12/19 0536 JDW (NOTE) SARS-CoV-2 target nucleic acids are DETECTED  SARS-CoV-2 RNA is generally detectable in upper respiratory specimens  during the acute phase of infection.  Positive results are indicative  of the presence of the identified virus, but do not rule out bacterial infection or co-infection with other pathogens not detected by the test.  Clinical correlation with patient history and  other diagnostic information is necessary to determine patient infection status.  The expected result is negative.  Fact Sheet for Patients:   StrictlyIdeas.no   Fact Sheet for Healthcare Providers:   BankingDealers.co.za    This test is not yet approved or cleared by the [place] FDA and  has been authorized for detection and/or diagnosis of SARS-CoV-2 by FDA under an  Emergency Use Authorization (EUA).  This EUA will remain in effect (meaning this test ca n be used) for the duration of  the COVID-19 declaration under Section 564(b)(1) of the Act, 21 U.S.C. section 360-bbb-3(b)(1), unless the authorization is terminated or revoked sooner.  Performed at [facility], [place] [address], [place], [place] [address]   Culture, blood (Routine X 2) w Reflex to ID Panel  Status: None (Preliminary result)   Collection Time: 11/12/19  4:11 AM   Specimen: BLOOD RIGHT HAND  Result Value Ref Range Status   Specimen Description BLOOD RIGHT HAND  Final   Special Requests   Final    BOTTLES DRAWN AEROBIC AND ANAEROBIC Blood Culture results may not be optimal due to an inadequate volume of blood received in culture bottles   Culture   Final    NO GROWTH 2 DAYS Performed at [facility], [place] [address], [place], [place] [address]    Report Status PENDING  Incomplete  Culture, blood (Routine X 2) w Reflex to ID Panel     Status: None (Preliminary result)   Collection Time: 11/12/19 11:16 AM   Specimen: BLOOD  Result Value Ref Range Status   Specimen Description BLOOD LEFT ANTECUBITAL  Final   Special Requests   Final    BOTTLES DRAWN AEROBIC AND ANAEROBIC Blood Culture adequate volume   Culture   Final    NO GROWTH 2 DAYS Performed at [facility], [place] [address] [place], [place] [address]    Report Status PENDING  Incomplete   Time coordinating discharge: 35 minutes  SIGNED:  Kerney Elbe, DO [facility] 11/14/2019, 10:57 AM Pager is on [place]  If 7PM-7AM, please contact night-coverage [email or web address]

## 2019-11-14 NOTE — Discharge Instructions (Signed)
Patient scheduled for outpatient Remdesivir infusions at 9am on Saturday 8/28 and Sunday 8/29 at [facility]. Please inform the patient to park at [address] [place], [place], as staff will be escorting the patient through the east entrance of the hospital.    There is a wave flag banner located near the entrance on [address]. Turn into this entrance and immediately turn left and park in 1 of the 5 designated Covid Infusion Parking spots. There is a phone number on the sign, please call and let the staff know what spot you are in and we will come out and get you. For questions call [phone].  Thanks.

## 2019-11-15 ENCOUNTER — Ambulatory Visit (HOSPITAL_COMMUNITY)
Admit: 2019-11-15 | Discharge: 2019-11-15 | Disposition: A | Discharge status: Home / Self Care | Payer: HRSA Program | Attending: Pulmonary Disease | Admitting: Pulmonary Disease

## 2019-11-15 DIAGNOSIS — U071 COVID-19: Secondary | ICD-10-CM | POA: Diagnosis present

## 2019-11-15 MED ORDER — DIPHENHYDRAMINE HCL 50 MG/ML IJ SOLN: 50.0000 mg | Freq: Once | INTRAMUSCULAR | Status: DC | PRN

## 2019-11-15 MED ORDER — EPINEPHRINE 0.3 MG/0.3ML IJ SOAJ: 0.3000 mg | Freq: Once | INTRAMUSCULAR | Status: DC | PRN

## 2019-11-15 MED ORDER — FAMOTIDINE IN NACL 20-0.9 MG/50ML-% IV SOLN: 20.0000 mg | Freq: Once | INTRAVENOUS | Status: DC | PRN

## 2019-11-15 MED ORDER — SODIUM CHLORIDE 0.9 % IV SOLN: INTRAVENOUS | Status: DC | PRN

## 2019-11-15 MED ORDER — SODIUM CHLORIDE 0.9 % IV SOLN
100.0000 mg | Freq: Once | INTRAVENOUS | Status: AC
  Administered 2019-11-15: 100 mg via INTRAVENOUS
  Filled 2019-11-15: qty 20

## 2019-11-15 MED ORDER — ALBUTEROL SULFATE HFA 108 (90 BASE) MCG/ACT IN AERS: 2.0000 | INHALATION_SPRAY | Freq: Once | RESPIRATORY_TRACT | Status: DC | PRN

## 2019-11-15 MED ORDER — METHYLPREDNISOLONE SODIUM SUCC 125 MG IJ SOLR: 125.0000 mg | Freq: Once | INTRAMUSCULAR | Status: DC | PRN

## 2019-11-15 NOTE — Discharge Instructions (Signed)
10 Things You Can Do to Manage Your COVID-19 Symptoms at Home If you have possible or confirmed COVID-19: 1. Stay home from work and school. And stay away from other public places. If you must go out, avoid using any kind of public transportation, ridesharing, or taxis. 2. Monitor your symptoms carefully. If your symptoms get worse, call your healthcare provider immediately. 3. Get rest and stay hydrated. 4. If you have a medical appointment, call the healthcare provider ahead of time and tell them that you have or may have COVID-19. 5. For medical emergencies, call 911 and notify the dispatch personnel that you have or may have COVID-19. 6. Cover your cough and sneezes with a tissue or use the inside of your elbow. 7. Wash your hands often with soap and water for at least 20 seconds or clean your hands with an alcohol-based hand sanitizer that contains at least 60% alcohol. 8. As much as possible, stay in a specific room and away from other people in your home. Also, you should use a separate bathroom, if available. If you need to be around other people in or outside of the home, wear a mask. 9. Avoid sharing personal items with other people in your household, like dishes, towels, and bedding. 10. Clean all surfaces that are touched often, like counters, tabletops, and doorknobs. Use household cleaning sprays or wipes according to the label instructions. cdc.gov/coronavirus 09/18/2018 This information is not intended to replace advice given to you by your health care provider. Make sure you discuss any questions you have with your health care provider. Document Revised: 02/20/2019 Document Reviewed: 02/20/2019 Elsevier Patient Education  2020 Elsevier Inc.  

## 2019-11-15 NOTE — Progress Notes (Signed)
  Diagnosis: COVID-19  Physician: Dr. Wright  Procedure: Covid Infusion Clinic Med: remdesivir infusion - Provided patient with remdesivir fact sheet for patients, parents and caregivers prior to infusion.  Complications: No immediate complications noted.  Discharge: Discharged home   Braxtyn Dorff R Markala Sitts 11/15/2019   

## 2019-11-16 ENCOUNTER — Ambulatory Visit (HOSPITAL_COMMUNITY)
Admit: 2019-11-16 | Discharge: 2019-11-16 | Disposition: A | Discharge status: Home / Self Care | Payer: HRSA Program | Source: Ambulatory Visit | Attending: Pulmonary Disease | Admitting: Pulmonary Disease

## 2019-11-16 DIAGNOSIS — U071 COVID-19: Secondary | ICD-10-CM | POA: Insufficient documentation

## 2019-11-16 DIAGNOSIS — J1282 Pneumonia due to coronavirus disease 2019: Secondary | ICD-10-CM | POA: Insufficient documentation

## 2019-11-16 MED ORDER — DIPHENHYDRAMINE HCL 50 MG/ML IJ SOLN: 50.0000 mg | Freq: Once | INTRAMUSCULAR | Status: DC | PRN

## 2019-11-16 MED ORDER — EPINEPHRINE 0.3 MG/0.3ML IJ SOAJ: 0.3000 mg | Freq: Once | INTRAMUSCULAR | Status: DC | PRN

## 2019-11-16 MED ORDER — SODIUM CHLORIDE 0.9 % IV SOLN: INTRAVENOUS | Status: DC | PRN

## 2019-11-16 MED ORDER — SODIUM CHLORIDE 0.9 % IV SOLN
100.0000 mg | Freq: Once | INTRAVENOUS | Status: AC
  Administered 2019-11-16: 100 mg via INTRAVENOUS
  Filled 2019-11-16: qty 20

## 2019-11-16 MED ORDER — FAMOTIDINE IN NACL 20-0.9 MG/50ML-% IV SOLN: 20.0000 mg | Freq: Once | INTRAVENOUS | Status: DC | PRN

## 2019-11-16 MED ORDER — ALBUTEROL SULFATE HFA 108 (90 BASE) MCG/ACT IN AERS: 2.0000 | INHALATION_SPRAY | Freq: Once | RESPIRATORY_TRACT | Status: DC | PRN

## 2019-11-16 MED ORDER — METHYLPREDNISOLONE SODIUM SUCC 125 MG IJ SOLR: 125.0000 mg | Freq: Once | INTRAMUSCULAR | Status: DC | PRN

## 2019-11-16 MED ORDER — SODIUM CHLORIDE 0.9 % IV SOLN: 100.0000 mg | Freq: Once | INTRAVENOUS | Status: DC

## 2019-11-16 NOTE — Discharge Instructions (Signed)

## 2019-11-16 NOTE — Progress Notes (Signed)
  Diagnosis: COVID-19  Physician: Wright, MD  Procedure: Covid Infusion Clinic Med: remdesivir infusion - Provided patient with remdesivir fact sheet for patients, parents and caregivers prior to infusion.  Complications: No immediate complications noted.  Discharge: Discharged home   Carly Bullock [name] 11/16/2019   

## 2019-11-17 LAB — CULTURE, BLOOD (ROUTINE X 2)
Culture: NO GROWTH
Culture: NO GROWTH
Special Requests: ADEQUATE

## 2019-11-19 DIAGNOSIS — R790 Abnormal level of blood mineral: Secondary | ICD-10-CM

## 2019-11-19 DIAGNOSIS — F419 Anxiety disorder, unspecified: Secondary | ICD-10-CM

## 2019-11-19 DIAGNOSIS — D509 Iron deficiency anemia, unspecified: Secondary | ICD-10-CM

## 2019-11-19 DIAGNOSIS — E559 Vitamin D deficiency, unspecified: Secondary | ICD-10-CM

## 2019-11-19 HISTORY — DX: Anxiety disorder, unspecified: F41.9

## 2019-11-19 HISTORY — DX: Vitamin D deficiency, unspecified: E55.9

## 2019-11-19 HISTORY — DX: Iron deficiency anemia, unspecified: D50.9

## 2019-11-19 HISTORY — DX: Abnormal level of blood mineral: R79.0

## 2019-11-20 ENCOUNTER — Telehealth: Payer: Self-pay | Admitting: Family Medicine

## 2019-11-20 NOTE — Telephone Encounter (Signed)
Attempted to contact patient to get her scheduled for a GYN visit for menorrhagia with any MD. No answer, and unable to leave a voicemail due to it not being set-up.

## 2019-11-27 ENCOUNTER — Other Ambulatory Visit: Payer: Self-pay

## 2019-11-27 ENCOUNTER — Encounter: Payer: Self-pay | Admitting: Nurse Practitioner

## 2019-11-27 ENCOUNTER — Ambulatory Visit (INDEPENDENT_AMBULATORY_CARE_PROVIDER_SITE_OTHER): Payer: Self-pay | Admitting: Nurse Practitioner

## 2019-11-27 VITALS — BP 124/82 | HR 97 | Temp 97.5°F | Wt 234.0 lb

## 2019-11-27 DIAGNOSIS — U071 COVID-19: Secondary | ICD-10-CM

## 2019-11-27 DIAGNOSIS — D649 Anemia, unspecified: Secondary | ICD-10-CM

## 2019-11-27 DIAGNOSIS — R739 Hyperglycemia, unspecified: Secondary | ICD-10-CM

## 2019-11-27 DIAGNOSIS — J1282 Pneumonia due to coronavirus disease 2019: Secondary | ICD-10-CM

## 2019-11-27 NOTE — Patient Instructions (Addendum)
Covid Anemia Elevated blood glucose:  Glad you are better!  Will check repeat labs today  Please follow up with OB/GYN   Will set up appointment today to establish care with new PCP  Stay active  May start multivitamin  Follow up:  Follow up as needed

## 2019-11-27 NOTE — Assessment & Plan Note (Signed)
Anemia Elevated blood glucose:  Glad you are better!  Will check repeat labs today  Please follow up with OB/GYN   Will set up appointment today to establish care with new PCP  Stay active  May start multivitamin  Follow up:  Follow up as needed

## 2019-11-27 NOTE — Progress Notes (Signed)
@Patient  ID: , female    DOB: 03-27-1995, 24 y.o.   MRN: [id number]  Chief Complaint  Patient presents with  . COVID Follow up    Positive 8/24. Hosp. 8/24-8/27 Infusion 8/28. No longer having any sx.     Referring provider: No ref. provider found   24 year old female with no significant health history.  Diagnosed with Covid on 11/11/2019.  HPI  Patient presents today for post COVID care clinic visit.  She was seen in the ED on 11/11/2019 and admitted to the hospital she was discharged home on 11/14/2019.  Patient was treated with remdesivir, IV steroids and IV fluids.  Patient was found to be anemic and was given 2 L of packed red blood cells.  Patient had stated in the ED that she has been having heavy menstrual cycles lasting 2 weeks at a time since February.  She was advised to follow-up with OB/GYN.  She does have a scheduled appointment scheduled.  Patient does not have a PCP and will need an appointment set up today to establish care with a PCP.  Patient was also noted to be hyperglycemic in the hospital and most likely worsening in the setting of glucocorticoids.  Will check hemoglobin A1c today.  Patient does state that she is much improved since hospital discharge.  She denies any significant shortness of breath or cough at this point. Denies f/c/s, n/v/d, hemoptysis, PND, chest pain or edema.       No Known Allergies   There is no immunization history on file for this patient.  Past Medical History:  Diagnosis Date  . Anemia     Tobacco History: Social History   Tobacco Use  Smoking Status Never Smoker  Smokeless Tobacco Never Used   Counseling given: Not Answered   Outpatient Encounter Medications as of 11/27/2019  Medication Sig  . ascorbic acid (VITAMIN C) 500 MG tablet Take 1 tablet (500 mg total) by mouth daily.  . Vitamin D3 (VITAMIN D) 25 MCG tablet Take 1 tablet (1,000 Units total) by mouth daily.  01/27/2020 zinc sulfate 220 (50 Zn) MG capsule Take 1  capsule (220 mg total) by mouth daily.  Marland Kitchen guaiFENesin-dextromethorphan (ROBITUSSIN DM) 100-10 MG/5ML syrup Take 10 mLs by mouth every 4 (four) hours as needed for cough. (Patient not taking: Reported on 11/27/2019)   No facility-administered encounter medications on file as of 11/27/2019.     Review of Systems  Review of Systems  Constitutional: Negative.  Negative for chills, fatigue and fever.  HENT: Negative.   Respiratory: Negative for cough and shortness of breath.   Cardiovascular: Negative.  Negative for chest pain, palpitations and leg swelling.  Gastrointestinal: Negative.   Allergic/Immunologic: Negative.   Neurological: Negative.   Psychiatric/Behavioral: Negative.        Physical Exam  BP 124/82 (BP Location: Right Arm)   Pulse 97   Temp (!) 97.5 F (36.4 C)   Wt 234 lb 0.1 oz (106.1 kg)   LMP 11/06/2019   SpO2 98%   BMI 37.77 kg/m   Wt Readings from Last 5 Encounters:  11/27/19 234 lb 0.1 oz (106.1 kg)  11/13/19 232 lb 12.9 oz (105.6 kg)     Physical Exam Vitals and nursing note reviewed.  Constitutional:      General: She is not in acute distress.    Appearance: She is well-developed.  Cardiovascular:     Rate and Rhythm: Normal rate and regular rhythm.  Pulmonary:     Effort:  Pulmonary effort is normal.     Breath sounds: Normal breath sounds.  Musculoskeletal:     Right lower leg: No edema.     Left lower leg: No edema.  Neurological:     Mental Status: She is alert and oriented to person, place, and time.  Psychiatric:        Mood and Affect: Mood normal.        Behavior: Behavior normal.     Imaging: DG CHEST PORT 1 VIEW  Result Date: 11/14/2019 CLINICAL DATA:  Shortness of breath.  COVID positive. EXAM: PORTABLE CHEST 1 VIEW COMPARISON:  11/11/2019. FINDINGS: Mediastinum and hilar structures are unremarkable. Heart size stable low lung volumes with bibasilar atelectasis. Patchy infiltrate right lung base again noted with interim  improvement from prior exam. No pleural effusion or pneumothorax. Mild thoracic spine scoliosis. IMPRESSION: Low lung volumes. Persistent patchy infiltrate right lung base with interim improvement from prior exam. Electronically Signed   By: Maisie Fus  Register   On: 11/14/2019 05:29   DG Chest Port 1 View  Result Date: 11/11/2019 CLINICAL DATA:  COVID-19 positivity EXAM: PORTABLE CHEST 1 VIEW COMPARISON:  None. FINDINGS: Cardiac shadows within normal limits. Patchy airspace opacities are noted in the right lung base consistent with the given clinical history. No sizable effusion is noted. No bony abnormality is noted. IMPRESSION: Patchy airspace opacities in the right base which would be consistent with the given clinical history. Electronically Signed   By: Alcide Clever M.D.   On: 11/11/2019 23:11     Assessment & Plan:   Pneumonia due to COVID-19 virus Anemia Elevated blood glucose:  Glad you are better!  Will check repeat labs today  Please follow up with OB/GYN   Will set up appointment today to establish care with new PCP  Stay active  May start multivitamin  Follow up:  Follow up as needed      Carly Andrew, NP 11/27/2019

## 2019-12-09 ENCOUNTER — Other Ambulatory Visit: Payer: Self-pay

## 2019-12-09 ENCOUNTER — Encounter: Payer: Self-pay | Admitting: Family Medicine

## 2019-12-09 ENCOUNTER — Ambulatory Visit (INDEPENDENT_AMBULATORY_CARE_PROVIDER_SITE_OTHER): Payer: Self-pay | Admitting: Family Medicine

## 2019-12-09 VITALS — BP 121/92 | HR 100 | Temp 97.8°F | Resp 18 | Ht 66.0 in | Wt 230.0 lb

## 2019-12-09 DIAGNOSIS — Z7689 Persons encountering health services in other specified circumstances: Secondary | ICD-10-CM

## 2019-12-09 DIAGNOSIS — Z0000 Encounter for general adult medical examination without abnormal findings: Secondary | ICD-10-CM

## 2019-12-09 DIAGNOSIS — Z7189 Other specified counseling: Secondary | ICD-10-CM

## 2019-12-09 DIAGNOSIS — Z23 Encounter for immunization: Secondary | ICD-10-CM

## 2019-12-09 DIAGNOSIS — Z09 Encounter for follow-up examination after completed treatment for conditions other than malignant neoplasm: Secondary | ICD-10-CM

## 2019-12-09 DIAGNOSIS — F419 Anxiety disorder, unspecified: Secondary | ICD-10-CM

## 2019-12-09 DIAGNOSIS — D649 Anemia, unspecified: Secondary | ICD-10-CM

## 2019-12-09 LAB — POCT URINALYSIS DIPSTICK
Bilirubin, UA: NEGATIVE
Glucose, UA: NEGATIVE
Ketones, UA: NEGATIVE
Leukocytes, UA: NEGATIVE
Nitrite, UA: NEGATIVE
Protein, UA: NEGATIVE
Spec Grav, UA: >=1.03 — AB (ref 1.010–1.025)
Urobilinogen, UA: 0.2 E.U./dL
pH, UA: 5.5 (ref 5.0–8.0)

## 2019-12-09 MED ORDER — FERROUS SULFATE 325 (65 FE) MG PO TABS: 325.0000 mg | ORAL_TABLET | Freq: Every day | ORAL | 6 refills | Status: DC

## 2019-12-09 MED ORDER — TETANUS-DIPHTHERIA TOXOIDS TD 5-2 LFU IM INJ: 0.5000 mL | INJECTION | Freq: Once | INTRAMUSCULAR | Status: DC

## 2019-12-09 NOTE — Patient Instructions (Signed)
Anemia  Anemia is a condition in which you do not have enough red blood cells or hemoglobin. Hemoglobin is a substance in red blood cells that carries oxygen. When you do not have enough red blood cells or hemoglobin (are anemic), your body cannot get enough oxygen and your organs may not work properly. As a result, you may feel very tired or have other problems. What are the causes? Common causes of anemia include:  Excessive bleeding. Anemia can be caused by excessive bleeding inside or outside the body, including bleeding from the intestine or from periods in women.  Poor nutrition.  Long-lasting (chronic) kidney, thyroid, and liver disease.  Bone marrow disorders.  Cancer and treatments for cancer.  HIV (human immunodeficiency virus) and AIDS (acquired immunodeficiency syndrome).  Treatments for HIV and AIDS.  Spleen problems.  Blood disorders.  Infections, medicines, and autoimmune disorders that destroy red blood cells. What are the signs or symptoms? Symptoms of this condition include:  Minor weakness.  Dizziness.  Headache.  Feeling heartbeats that are irregular or faster than normal (palpitations).  Shortness of breath, especially with exercise.  Paleness.  Cold sensitivity.  Indigestion.  Nausea.  Difficulty sleeping.  Difficulty concentrating. Symptoms may occur suddenly or develop slowly. If your anemia is mild, you may not have symptoms. How is this diagnosed? This condition is diagnosed based on:  Blood tests.  Your medical history.  A physical exam.  Bone marrow biopsy. Your health care provider may also check your stool (feces) for blood and may do additional testing to look for the cause of your bleeding. You may also have other tests, including:  Imaging tests, such as a CT scan or MRI.  Endoscopy.  Colonoscopy. How is this treated? Treatment for this condition depends on the cause. If you continue to lose a lot of blood, you may  need to be treated at a hospital. Treatment may include:  Taking supplements of iron, vitamin S31, or folic acid.  Taking a hormone medicine (erythropoietin) that can help to stimulate red blood cell growth.  Having a blood transfusion. This may be needed if you lose a lot of blood.  Making changes to your diet.  Having surgery to remove your spleen. Follow these instructions at home:  Take over-the-counter and prescription medicines only as told by your health care provider.  Take supplements only as told by your health care provider.  Follow any diet instructions that you were given.  Keep all follow-up visits as told by your health care provider. This is important. Contact a health care provider if:  You develop new bleeding anywhere in the body. Get help right away if:  You are very weak.  You are short of breath.  You have pain in your abdomen or chest.  You are dizzy or feel faint.  You have trouble concentrating.  You have bloody or black, tarry stools.  You vomit repeatedly or you vomit up blood. Summary  Anemia is a condition in which you do not have enough red blood cells or enough of a substance in your red blood cells that carries oxygen (hemoglobin).  Symptoms may occur suddenly or develop slowly.  If your anemia is mild, you may not have symptoms.  This condition is diagnosed with blood tests as well as a medical history and physical exam. Other tests may be needed.  Treatment for this condition depends on the cause of the anemia. This information is not intended to replace advice given to you by  your health care provider. Make sure you discuss any questions you have with your health care provider. Document Revised: 02/16/2017 Document Reviewed: 04/07/2016 Elsevier Patient Education  [place].

## 2019-12-09 NOTE — Progress Notes (Signed)
[facility]   New Patient--Hospital Follow Up--Establish Care  Subjective:  Patient ID: Carly Bullock, female    DOB: Jan 29, 1996  Age: 24 y.o. MRN: [id number]  CC:  Chief Complaint  Patient presents with  . Establish Care    Pt states she wants to dicuss her hemoglobin because she states it is low.    HPI Carly Bullock is a 24 year old female who presents for Hospital Follow Up and to Establish Care today.    Patient Active Problem List   Diagnosis Date Noted  . Pneumonia due to COVID-19 virus 11/12/2019  . Sepsis (HCC) 11/12/2019  . Nausea and vomiting 11/12/2019  . Symptomatic anemia 11/12/2019  . Menorrhagia 11/12/2019  . Pneumonia 11/12/2019    Current Status: This will be Carly Bullock's initial office visit with me. She was previously seeing Physician in [place], [place] for her PCP needs. Since her last office visit, she has had a Hospital Visit for Anemia, which she received 2 units of PRBCs on 11/17/2019 and while she was hospitalized, she was found to be positive for Coronavirus. Today. is doing well with no complaints. She denies fevers, chills, fatigue, recent infections, weight loss, and night sweats. She has not had any headaches, visual changes, dizziness, and falls. No chest pain, heart palpitations, cough and shortness of breath reported. No reports of GI problems such as nausea, vomiting, diarrhea, and constipation. She has no reports of blood in stools, dysuria and hematuria. No depression or anxiety reported. She is taking all medications as prescribed. She denies pain today.   Past Medical History:  Diagnosis Date  . Anemia   . Anxiety 11/2019  . Coronavirus infection 11/12/2019    History reviewed. No pertinent surgical history.  Family History  Problem Relation Age of Onset  . Breast cancer Mother   . Breast cancer Maternal Aunt   . Diabetes Maternal Aunt   . Diabetes Maternal Uncle   . Prostate cancer  Father     Social History   Socioeconomic History  . Marital status: Single    Spouse name: Not on file  . Number of children: Not on file  . Years of education: Not on file  . Highest education level: Not on file  Occupational History  . Not on file  Tobacco Use  . Smoking status: Never Smoker  . Smokeless tobacco: Never Used  Substance and Sexual Activity  . Alcohol use: Yes    Comment: Socially  . Drug use: Not Currently  . Sexual activity: Yes  Other Topics Concern  . Not on file  Social History Narrative  . Not on file   Social Determinants of Health   Financial Resource Strain:   . Difficulty of Paying Living Expenses: Not on file  Food Insecurity:   . Worried About Programme researcher, broadcasting/film/video in the Last Year: Not on file  . Ran Out of Food in the Last Year: Not on file  Transportation Needs:   . Lack of Transportation (Medical): Not on file  . Lack of Transportation (Non-Medical): Not on file  Physical Activity:   . Days of Exercise per Week: Not on file  . Minutes of Exercise per Session: Not on file  Stress:   . Feeling of Stress : Not on file  Social Connections:   . Frequency of Communication with Friends and Family: Not on file  . Frequency of Social Gatherings with Friends and Family: Not on file  .  Attends Religious Services: Not on file  . Active Member of Clubs or Organizations: Not on file  . Attends Banker Meetings: Not on file  . Marital Status: Not on file  Intimate Partner Violence:   . Fear of Current or Ex-Partner: Not on file  . Emotionally Abused: Not on file  . Physically Abused: Not on file  . Sexually Abused: Not on file    Outpatient Medications Prior to Visit  Medication Sig Dispense Refill  . ascorbic acid (VITAMIN C) 500 MG tablet Take 1 tablet (500 mg total) by mouth daily. 30 tablet 0  . Vitamin D3 (VITAMIN D) 25 MCG tablet Take 1 tablet (1,000 Units total) by mouth daily. 30 tablet 0  . zinc sulfate 220 (50 Zn) MG  capsule Take 1 capsule (220 mg total) by mouth daily. 30 capsule 0  . guaiFENesin-dextromethorphan (ROBITUSSIN DM) 100-10 MG/5ML syrup Take 10 mLs by mouth every 4 (four) hours as needed for cough. (Patient not taking: Reported on 11/27/2019) 118 mL 0   No facility-administered medications prior to visit.    No Known Allergies  ROS Review of Systems  Constitutional: Negative.   HENT: Negative.   Eyes: Negative.   Respiratory: Negative.   Cardiovascular: Negative.   Gastrointestinal: Negative.   Endocrine: Negative.   Genitourinary: Negative.   Musculoskeletal: Negative.   Skin: Negative.   Allergic/Immunologic: Negative.   Neurological: Negative.   Hematological: Negative.   Psychiatric/Behavioral: Negative.    Objective:    Physical Exam Vitals and nursing note reviewed.  Constitutional:      Appearance: Normal appearance.  HENT:     Head: Normocephalic and atraumatic.     Nose: Nose normal.     Mouth/Throat:     Mouth: Mucous membranes are moist.     Pharynx: Oropharynx is clear.  Cardiovascular:     Rate and Rhythm: Normal rate and regular rhythm.     Pulses: Normal pulses.     Heart sounds: Normal heart sounds.  Pulmonary:     Effort: Pulmonary effort is normal.     Breath sounds: Normal breath sounds.  Abdominal:     General: Bowel sounds are normal.     Palpations: Abdomen is soft.  Musculoskeletal:        General: Normal range of motion.     Cervical back: Normal range of motion and neck supple.  Skin:    General: Skin is warm and dry.  Neurological:     General: No focal deficit present.     Mental Status: She is alert and oriented to person, place, and time.  Psychiatric:        Mood and Affect: Mood normal.        Behavior: Behavior normal.        Thought Content: Thought content normal.        Judgment: Judgment normal.     BP (!) 121/92 (BP Location: Left Arm, Patient Position: Sitting, Cuff Size: Large)   Pulse 100   Temp 97.8 F (36.6 C)    Resp 18   Ht 5\' 6"  (1.676 m)   Wt 230 lb (104.3 kg)   LMP 12/06/2019 (Exact Date)   SpO2 99%   BMI 37.12 kg/m  Wt Readings from Last 3 Encounters:  12/09/19 230 lb (104.3 kg)  11/27/19 234 lb 0.1 oz (106.1 kg)  11/13/19 232 lb 12.9 oz (105.6 kg)   Health Maintenance Due  Topic Date Due  . Hepatitis C Screening  Never done  . COVID-19 Vaccine (1) Never done  . PAP-Cervical Cytology Screening  Never done  . PAP SMEAR-Modifier  Never done  . INFLUENZA VACCINE  Never done    There are no preventive care reminders to display for this patient.  No results found for: TSH Lab Results  Component Value Date   WBC 4.7 12/09/2019   HGB 9.7 (L) 12/09/2019   HCT 32.8 (L) 12/09/2019   MCV 63 (L) 12/09/2019   PLT 276 12/09/2019   Lab Results  Component Value Date   NA 138 12/09/2019   K 4.3 12/09/2019   CO2 19 (L) 12/09/2019   GLUCOSE 86 12/09/2019   BUN 10 12/09/2019   CREATININE 0.68 12/09/2019   BILITOT 0.3 12/09/2019   ALKPHOS 64 12/09/2019   AST 18 12/09/2019   ALT 24 12/09/2019   PROT 7.3 12/09/2019   ALBUMIN 4.5 12/09/2019   CALCIUM 9.6 12/09/2019   ANIONGAP 10 11/14/2019   No results found for: CHOL No results found for: HDL No results found for: LDLCALC No results found for: TRIG No results found for: CHOLHDL No results found for: [id number]    Assessment & Plan:   1. Hospital discharge follow-up  2. Encounter to establish care - Urinalysis Dipstick  3. Anemia, unspecified type - CBC with Differential - Comprehensive metabolic panel - Iron and TIBC([facility]) - Ferritin - Vitamin B12 - Vitamin D, 25-hydroxy - ferrous sulfate 325 (65 FE) MG tablet; Take 1 tablet (325 mg total) by mouth daily with breakfast.  Dispense: 30 tablet; Refill: 6  4. Educated about COVID-19 virus infection She has to wait 90 day post infection, then she will get COVID19 vaccination.   5. Anxiety  6. Healthcare maintenance - Tdap vaccine greater than or equal to 7yo  IM  7. Follow up She will follow up in 1 month.   Meds ordered this encounter  Medications  . ferrous sulfate 325 (65 FE) MG tablet    Sig: Take 1 tablet (325 mg total) by mouth daily with breakfast.    Dispense:  30 tablet    Refill:  6  . DISCONTD: tetanus & diphtheria toxoids (adult) (TENIVAC) injection 0.5 mL    Orders Placed This Encounter  Procedures  . Tdap vaccine greater than or equal to 7yo IM  . CBC with Differential  . Comprehensive metabolic panel  . Iron and TIBC([facility])  . Ferritin  . Vitamin B12  . Vitamin D, 25-hydroxy  . Urinalysis Dipstick    Referral Orders  No referral(s) requested today    Raliegh IpNatalie Jolly Carlini,  MSN, FNP-BC [facility] [address]  [place], [place] [phone] fax   Problem List Items Addressed This Visit    None    Visit Diagnoses    Hospital discharge follow-up    -  Primary   Encounter to establish care       Relevant Orders   Urinalysis Dipstick (Completed)   Anemia, unspecified type       Relevant Medications   ferrous sulfate 325 (65 FE) MG tablet   Other Relevant Orders   CBC with Differential (Completed)   Comprehensive metabolic panel (Completed)   Iron and TIBC([facility]/Sun[facility]) (Completed)   Ferritin (Completed)   Vitamin B12 (Completed)   Vitamin D, 25-hydroxy (Completed)   Educated about COVID-19 virus infection       Anxiety       Healthcare maintenance  Relevant Orders   Tdap vaccine greater than or equal to 7yo IM (Completed)   Follow up          Meds ordered this encounter  Medications  . ferrous sulfate 325 (65 FE) MG tablet    Sig: Take 1 tablet (325 mg total) by mouth daily with breakfast.    Dispense:  30 tablet    Refill:  6  . DISCONTD: tetanus & diphtheria toxoids (adult) (TENIVAC) injection 0.5 mL    Follow-up: Return in about 1 month (around 01/08/2020).     Kallie Locks, FNP

## 2019-12-10 LAB — COMPREHENSIVE METABOLIC PANEL
ALT: 24 IU/L (ref 0–32)
AST: 18 IU/L (ref 0–40)
Albumin/Globulin Ratio: 1.6 (ref 1.2–2.2)
Albumin: 4.5 g/dL (ref 3.9–5.0)
Alkaline Phosphatase: 64 IU/L (ref 44–121)
BUN/Creatinine Ratio: 15 (ref 9–23)
BUN: 10 mg/dL (ref 6–20)
Bilirubin Total: 0.3 mg/dL (ref 0.0–1.2)
CO2: 19 mmol/L — ABNORMAL LOW (ref 20–29)
Calcium: 9.6 mg/dL (ref 8.7–10.2)
Chloride: 104 mmol/L (ref 96–106)
Creatinine, Ser: 0.68 mg/dL (ref 0.57–1.00)
GFR calc Af Amer: 140 mL/min/{1.73_m2} (ref >59)
GFR calc non Af Amer: 121 mL/min/{1.73_m2} (ref >59)
Globulin, Total: 2.8 g/dL (ref 1.5–4.5)
Glucose: 86 mg/dL (ref 65–99)
Potassium: 4.3 mmol/L (ref 3.5–5.2)
Sodium: 138 mmol/L (ref 134–144)
Total Protein: 7.3 g/dL (ref 6.0–8.5)

## 2019-12-10 LAB — CBC WITH DIFFERENTIAL/PLATELET
Basophils Absolute: 0 10*3/uL (ref 0.0–0.2)
Basos: 1 %
EOS (ABSOLUTE): 0 10*3/uL (ref 0.0–0.4)
Eos: 1 %
Hematocrit: 32.8 % — ABNORMAL LOW (ref 34.0–46.6)
Hemoglobin: 9.7 g/dL — ABNORMAL LOW (ref 11.1–15.9)
Immature Grans (Abs): 0 10*3/uL (ref 0.0–0.1)
Immature Granulocytes: 0 %
Lymphocytes Absolute: 1.5 10*3/uL (ref 0.7–3.1)
Lymphs: 31 %
MCH: 18.7 pg — ABNORMAL LOW (ref 26.6–33.0)
MCHC: 29.6 g/dL — ABNORMAL LOW (ref 31.5–35.7)
MCV: 63 fL — ABNORMAL LOW (ref 79–97)
Monocytes Absolute: 0.6 10*3/uL (ref 0.1–0.9)
Monocytes: 13 %
Neutrophils Absolute: 2.6 10*3/uL (ref 1.4–7.0)
Neutrophils: 54 %
Platelets: 276 10*3/uL (ref 150–450)
RBC: 5.2 x10E6/uL (ref 3.77–5.28)
RDW: 29.5 % — ABNORMAL HIGH (ref 11.7–15.4)
WBC: 4.7 10*3/uL (ref 3.4–10.8)

## 2019-12-10 LAB — VITAMIN D 25 HYDROXY (VIT D DEFICIENCY, FRACTURES): Vit D, 25-Hydroxy: 16.2 ng/mL — ABNORMAL LOW (ref 30.0–100.0)

## 2019-12-10 LAB — IRON AND TIBC
Iron Saturation: 8 % — CL (ref 15–55)
Iron: 32 ug/dL (ref 27–159)
Total Iron Binding Capacity: 401 ug/dL (ref 250–450)
UIBC: 369 ug/dL (ref 131–425)

## 2019-12-10 LAB — FERRITIN: Ferritin: 9 ng/mL — ABNORMAL LOW (ref 15–150)

## 2019-12-10 LAB — VITAMIN B12: Vitamin B-12: 705 pg/mL (ref 232–1245)

## 2019-12-12 ENCOUNTER — Telehealth: Payer: Self-pay | Admitting: Family Medicine

## 2019-12-12 ENCOUNTER — Encounter: Payer: Self-pay | Admitting: Obstetrics and Gynecology

## 2019-12-12 ENCOUNTER — Encounter: Payer: Self-pay | Admitting: Family Medicine

## 2019-12-12 ENCOUNTER — Other Ambulatory Visit: Payer: Self-pay

## 2019-12-12 ENCOUNTER — Ambulatory Visit (INDEPENDENT_AMBULATORY_CARE_PROVIDER_SITE_OTHER): Payer: Self-pay | Admitting: Obstetrics and Gynecology

## 2019-12-12 ENCOUNTER — Other Ambulatory Visit (HOSPITAL_COMMUNITY)
Admission: RE | Admit: 2019-12-12 | Discharge: 2019-12-12 | Disposition: A | Discharge status: Home / Self Care | Payer: Self-pay | Source: Ambulatory Visit | Attending: Obstetrics and Gynecology | Admitting: Obstetrics and Gynecology

## 2019-12-12 VITALS — BP 126/83 | HR 96 | Ht 66.0 in | Wt 233.0 lb

## 2019-12-12 DIAGNOSIS — N920 Excessive and frequent menstruation with regular cycle: Secondary | ICD-10-CM | POA: Insufficient documentation

## 2019-12-12 NOTE — Patient Instructions (Signed)
Menorrhagia Menorrhagia is when your menstrual periods are heavy or last longer than normal. Follow these instructions at home: Medicines   Take over-the-counter and prescription medicines exactly as told by your doctor. This includes iron pills.  Do not change or switch medicines without asking your doctor.  Do not take aspirin or medicines that contain aspirin 1 week before or during your period. Aspirin may make bleeding worse. General instructions  If you need to change your pad or tampon more than once every 2 hours, limit your activity until the bleeding stops.  Iron pills can cause problems when pooping (constipation). To prevent or treat pooping problems while taking prescription iron pills, your doctor may suggest that you: ? Drink enough fluid to keep your pee (urine) clear or pale yellow. ? Take over-the-counter or prescription medicines. ? Eat foods that are high in fiber. These foods include:  Fresh fruits and vegetables.  Whole grains.  Beans. ? Limit foods that are high in fat and processed sugars. This includes fried and sweet foods.  Eat healthy meals and foods that are high in iron. Foods that have a lot of iron include: ? Leafy green vegetables. ? Meat. ? Liver. ? Eggs. ? Whole grain breads and cereals.  Do not try to lose weight until your heavy bleeding has stopped and you have normal amounts of iron in your blood. If you need to lose weight, work with your doctor.  Keep all follow-up visits as told by your doctor. This is important. Contact a doctor if:  You soak through a pad or tampon every 1 or 2 hours, and this happens every time you have a period.  You need to use pads and tampons at the same time because you are bleeding so much.  You are taking medicine and you: ? Feel sick to your stomach (nauseous). ? Throw up (vomit). ? Have watery poop (diarrhea).  You have other problems that may be related to the medicine you are taking. Get help  right away if:  You soak through more than a pad or tampon in 1 hour.  You pass clots bigger than 1 inch (2.5 cm) wide.  You feel short of breath.  You feel like your heart is beating too fast.  You feel dizzy or you pass out (faint).  You feel very weak or tired. Summary  Menorrhagia is when your menstrual periods are heavy or last longer than normal.  Take over-the-counter and prescription medicines exactly as told by your doctor. This includes iron pills.  Contact a doctor if you soak through more than a pad or tampon in 1 hour or are passing large clots. This information is not intended to replace advice given to you by your health care provider. Make sure you discuss any questions you have with your health care provider. Document Revised: 06/13/2017 Document Reviewed: 03/27/2016 Elsevier Patient Education  2020 Elsevier Inc.  

## 2019-12-12 NOTE — Telephone Encounter (Signed)
Multiple attempts to contact patient to discuss recent lab results.

## 2019-12-12 NOTE — Progress Notes (Signed)
° °  Subjective:    Patient ID: Carly Bullock, female    DOB: January 22, 1996, 24 y.o.   MRN: [id number]  HPI  24 yo G1P0 seen at [facility] for evaluation of menorrhagia.  LMP 9/18-current.  Per pt she had a miscarriage February 2021 and she bled for 3 weeks.  Then in the spring she began having periods monthly lasting 14 days at a time.  She was symptomatic and diagnosed for covid 10/2019.  During her treatment she was found to be severely anemic and was transfused 2 units PRBC.  Per pt this menses is closer to her normal and is ending today.  She has taken OCP in the past with good response.  She discontinued due to cost.    Review of Systems  Constitutional: Negative.  Negative for fatigue.  HENT: Negative.   Eyes: Negative.   Respiratory: Negative.   Cardiovascular: Negative.   Gastrointestinal: Negative.   Endocrine: Negative.   Genitourinary: Positive for menstrual problem and vaginal bleeding.  Musculoskeletal: Negative.   Neurological: Negative.   Hematological: Negative.   Psychiatric/Behavioral: Negative.        Objective:   Physical Exam Constitutional:      Appearance: Normal appearance. She is normal weight.  HENT:     Head: Normocephalic and atraumatic.  Cardiovascular:     Rate and Rhythm: Normal rate and regular rhythm.     Heart sounds: Normal heart sounds.  Pulmonary:     Effort: Pulmonary effort is normal.     Breath sounds: Normal breath sounds.  Abdominal:     General: Abdomen is flat.     Palpations: Abdomen is soft.  Genitourinary:    Comments: SSE: No uterine bleeding, cvx WNL uterus retroflexed, normal size No adnexal masses Musculoskeletal:        General: Normal range of motion.  Skin:    General: Skin is dry.  Neurological:     Mental Status: She is alert.  Psychiatric:        Mood and Affect: Mood normal.        Behavior: Behavior normal.     Vitals:   12/12/19 0904  BP: 126/83  Pulse: 96       Assessment & Plan:   1. Menorrhagia with  regular cycle Since menses seem to have equilibrated will pursue expectant management Pt will keep menstrual calender and we will have virtual visit in 4 months.  If the prolonged or heavy bleeding returns will then pursue hormone panel, pelvic u/s and restart of OCP Pt agrees with the plan.  Pap taken at visit since pt has never had a pap smear - Cytology - PAP( [facility])

## 2019-12-12 NOTE — Progress Notes (Signed)
Believes in feb she has a miscarriage (lots of bleeding and passing large clots for 3 weeks) and has had a cycle for about 2 week at a time cant really control the flow because she uses the cup. States this cycle seems to be the most normal one since December.

## 2019-12-15 LAB — CYTOLOGY - PAP
Chlamydia: NEGATIVE
Comment: NEGATIVE
Comment: NEGATIVE
Comment: NORMAL
Diagnosis: UNDETERMINED — AB
High risk HPV: NEGATIVE
Neisseria Gonorrhea: NEGATIVE

## 2019-12-16 ENCOUNTER — Other Ambulatory Visit: Payer: Self-pay | Admitting: Family Medicine

## 2019-12-16 ENCOUNTER — Encounter: Payer: Self-pay | Admitting: Family Medicine

## 2019-12-16 ENCOUNTER — Telehealth: Payer: Self-pay | Admitting: Family Medicine

## 2019-12-16 DIAGNOSIS — E559 Vitamin D deficiency, unspecified: Secondary | ICD-10-CM

## 2019-12-16 MED ORDER — VITAMIN D (ERGOCALCIFEROL) 1.25 MG (50000 UNIT) PO CAPS: 50000.0000 [IU] | ORAL_CAPSULE | ORAL | 6 refills | Status: DC

## 2019-12-16 NOTE — Telephone Encounter (Signed)
Attempted to contact patient to review labs, but unable to leave voice message. Also contacted her brother Onalee Hua @ [phone] and was able to leave message to notify patient to contact our office at her earliest convenience.

## 2020-03-31 ENCOUNTER — Other Ambulatory Visit: Payer: Self-pay

## 2020-03-31 ENCOUNTER — Ambulatory Visit: Payer: Self-pay | Admitting: Family Medicine

## 2020-03-31 ENCOUNTER — Telehealth (INDEPENDENT_AMBULATORY_CARE_PROVIDER_SITE_OTHER): Payer: HRSA Program | Admitting: Family Medicine

## 2020-03-31 ENCOUNTER — Encounter: Payer: Self-pay | Admitting: Family Medicine

## 2020-03-31 VITALS — Ht 66.0 in | Wt 231.0 lb

## 2020-03-31 DIAGNOSIS — M79672 Pain in left foot: Secondary | ICD-10-CM

## 2020-03-31 DIAGNOSIS — Z09 Encounter for follow-up examination after completed treatment for conditions other than malignant neoplasm: Secondary | ICD-10-CM

## 2020-03-31 DIAGNOSIS — F419 Anxiety disorder, unspecified: Secondary | ICD-10-CM

## 2020-03-31 MED ORDER — IBUPROFEN 800 MG PO TABS: 800.0000 mg | ORAL_TABLET | Freq: Three times a day (TID) | ORAL | 3 refills | Status: DC | PRN

## 2020-03-31 NOTE — Progress Notes (Signed)
Virtual Visit via Telephone Note  I connected with Carly Bullock on 03/31/20 at  2:00 PM EST by telephone and verified that I am speaking with the correct person using two identifiers.  Location: Patient: Home Provider: Office   I discussed the limitations, risks, security and privacy concerns of performing an evaluation and management service by telephone and the availability of in person appointments. I also discussed with the patient that there may be a patient responsible charge related to this service. The patient expressed understanding and agreed to proceed.   History of Present Illness:  Patient Active Problem List   Diagnosis Date Noted  . Pneumonia due to COVID-19 virus 11/12/2019  . Sepsis (HCC) 11/12/2019  . Nausea and vomiting 11/12/2019  . Symptomatic anemia 11/12/2019  . Menorrhagia 11/12/2019  . Pneumonia 11/12/2019   Current Status: Since her last office visit, she has c/o left foot with ankle swelling. She has not taken any medication for relief of symptoms. She denies fevers, chills, fatigue, recent infections, weight loss, and night sweats. She has not had any headaches, visual changes, dizziness, and falls. No chest pain, heart palpitations, cough and shortness of breath reported. Denies GI problems such as nausea, vomiting, diarrhea, and constipation. She has no reports of blood in stools, dysuria and hematuria. No depression or anxiety reported today. She is taking all medications as prescribed.  Observations/Objective:  Telephone Virtual Visit   Assessment and Plan:  1. Foot pain, left We will initiate Motrin - ibuprofen (ADVIL) 800 MG tablet; Take 1 tablet (800 mg total) by mouth every 8 (eight) hours as needed.  Dispense: 30 tablet; Refill: 3  2. Anxiety  3. Follow up She will follow up in 1-2 months for assessment of foot pain.  Meds ordered this encounter  Medications  . ibuprofen (ADVIL) 800 MG tablet    Sig: Take 1 tablet (800 mg total) by  mouth every 8 (eight) hours as needed.    Dispense:  30 tablet    Refill:  3    No orders of the defined types were placed in this encounter.   Referral Orders  No referral(s) requested today    Raliegh Ip, MSN, ANE, FNP-BC [facility]/Internal Medicine/Sickle Cell Center [facility] [address] [place], [place] [address] [phone] fax  I discussed the assessment and treatment plan with the patient. The patient was provided an opportunity to ask questions and all were answered. The patient agreed with the plan and demonstrated an understanding of the instructions.   The patient was advised to call back or seek an in-person evaluation if the symptoms worsen or if the condition fails to improve as anticipated.  I provided 20 minutes of non-face-to-face time during this encounter.   Kallie Locks, FNP

## 2020-04-01 ENCOUNTER — Encounter: Payer: Self-pay | Admitting: Family Medicine

## 2020-04-01 NOTE — Addendum Note (Signed)
Addended by: Kallie Locks on: 04/01/2020 09:50 AM   Modules accepted: Level of Service

## 2020-06-01 ENCOUNTER — Ambulatory Visit: Payer: Self-pay | Admitting: Family Medicine

## 2020-11-08 ENCOUNTER — Ambulatory Visit: Payer: Self-pay | Admitting: Nurse Practitioner

## 2020-12-05 NOTE — Progress Notes (Signed)
Subjective:    Carly Bullock - 25 y.o. female MRN [id number]  Date of birth: Feb 26, 1996  HPI  Carly Bullock is to establish care.    Current issues and/or concerns: Reports irregular periods and bleeding more than normal. Began around 2021 after having Covid. Reports recently in July 2022 had a regular period  which then turned into a longer than normal period with spotting with some clotting. Denies bleeding profusely. Still taking iron supplement. Planning to schedule with Obstetrics Gynecology soon.     ROS per HPI     Health Maintenance:  Health Maintenance Due  Topic Date Due   COVID-19 Vaccine (1) Never done   Hepatitis C Screening  Never done     Past Medical History: Patient Active Problem List   Diagnosis Date Noted   Pneumonia due to COVID-19 virus 11/12/2019   Sepsis (HCC) 11/12/2019   Nausea and vomiting 11/12/2019   Symptomatic anemia 11/12/2019   Menorrhagia 11/12/2019   Pneumonia 11/12/2019    Social History   reports that she has never smoked. She has never used smokeless tobacco. She reports current alcohol use of about 2.0 standard drinks per week. She reports that she does not currently use drugs.   Family History  family history includes Breast cancer in her maternal aunt and mother; Diabetes in her maternal aunt and maternal uncle; Prostate cancer in her father.   Medications: reviewed and updated   Objective:   Physical Exam BP 107/75 (BP Location: Left Arm, Patient Position: Sitting, Cuff Size: Large)   Pulse 91   Temp 99.1 F (37.3 C)   Resp 16   Ht 5' 5.98" (1.676 m)   Wt 244 lb 12.8 oz (111 kg)   SpO2 98%   BMI 39.53 kg/m   Physical Exam HENT:     Head: Normocephalic.  Eyes:     Extraocular Movements: Extraocular movements intact.     Conjunctiva/sclera: Conjunctivae normal.     Pupils: Pupils are equal, round, and reactive to light.  Cardiovascular:     Rate and Rhythm: Normal rate and regular rhythm.     Pulses: Normal  pulses.     Heart sounds: Normal heart sounds.  Musculoskeletal:     Cervical back: Normal range of motion and neck supple.  Neurological:     General: No focal deficit present.     Mental Status: She is alert and oriented to person, place, and time.  Psychiatric:        Mood and Affect: Mood normal.        Behavior: Behavior normal.      Assessment & Plan:  1. Encounter to establish care: - Patient presents today to establish care.  - Return for annual physical examination, labs, and health maintenance. Arrive fasting meaning having no food for at least 8 hours prior to appointment. You may have only water or black coffee. Please take scheduled medications as normal.  2. Irregular menses: - Keep all scheduled appointments with Obstetrics Gynecology.  - Follow-up with primary provider as scheduled.     Patient was given clear instructions to go to Emergency Department or return to medical center if symptoms don't improve, worsen, or new problems develop.The patient verbalized understanding.  I discussed the assessment and treatment plan with the patient. The patient was provided an opportunity to ask questions and all were answered. The patient agreed with the plan and demonstrated an understanding of the instructions.   The patient was advised to call back or  seek an in-person evaluation if the symptoms worsen or if the condition fails to improve as anticipated.    Ricky Stabs, NP 12/09/2020, 8:35 PM Primary Care at [facility]

## 2020-12-09 ENCOUNTER — Encounter: Payer: Self-pay | Admitting: Family

## 2020-12-09 ENCOUNTER — Ambulatory Visit (INDEPENDENT_AMBULATORY_CARE_PROVIDER_SITE_OTHER): Payer: Self-pay | Admitting: Family

## 2020-12-09 ENCOUNTER — Other Ambulatory Visit: Payer: Self-pay

## 2020-12-09 VITALS — BP 107/75 | HR 91 | Temp 99.1°F | Resp 16 | Ht 65.98 in | Wt 244.8 lb

## 2020-12-09 DIAGNOSIS — N926 Irregular menstruation, unspecified: Secondary | ICD-10-CM

## 2020-12-09 DIAGNOSIS — Z7689 Persons encountering health services in other specified circumstances: Secondary | ICD-10-CM

## 2020-12-09 NOTE — Patient Instructions (Addendum)
Thank you for choosing Primary Care at [facility] for your medical home!    Carly Bullock was seen by Rema Fendt, NP today.   Carly Bullock's primary care provider is Haven Foss Jodi Geralds, NP.   For the best care possible,  you should try to see Rema Fendt, NP whenever you come to clinic.   We look forward to seeing you again soon!  If you have any questions about your visit today,  please call us at [phone]  Or feel free to reach your provider via MyChart.    Keeping you healthy   Get these tests Blood pressure- Have your blood pressure checked once a year by your healthcare provider.  Normal blood pressure is 120/80. Weight- Have your body mass index (BMI) calculated to screen for obesity.  BMI is a measure of body fat based on height and weight. You can also calculate your own BMI at https://www.west-esparza.com/. Cholesterol- Have your cholesterol checked regularly starting at age 34, sooner may be necessary if you have diabetes, high blood pressure, if a family member developed heart diseases at an early age or if you smoke.  Chlamydia, HIV, and other sexual transmitted disease- Get screened each year until the age of 49 then within three months of each new sexual partner. Diabetes- Have your blood sugar checked regularly if you have high blood pressure, high cholesterol, a family history of diabetes or if you are overweight.   Get these vaccines Flu shot- Every fall. Tetanus shot- Every 10 years. Menactra- Single dose; prevents meningitis.   Take these steps Don't smoke- If you do smoke, ask your healthcare provider about quitting. For tips on how to quit, go to www.smokefree.gov or call 1-800-QUIT-NOW. Be physically active- Exercise 5 days a week for at least 30 minutes.  If you are not already physically active start slow and gradually work up to 30 minutes of moderate physical activity.  Examples of moderate activity include walking briskly, mowing the yard, dancing,  swimming bicycling, etc. Eat a healthy diet- Eat a variety of healthy foods such as fruits, vegetables, low fat milk, low fat cheese, yogurt, lean meats, poultry, fish, beans, tofu, etc.  For more information on healthy eating, go to www.thenutritionsource.org Drink alcohol in moderation- Limit alcohol intake two drinks or less a day.  Never drink and drive. Dentist- Brush and floss teeth twice daily; visit your dentis twice a year. Depression-Your emotional health is as important as your physical health.  If you're feeling down, losing interest in things you normally enjoy please talk with your healthcare provider. Gun Safety- If you keep a gun in your home, keep it unloaded and with the safety lock on.  Bullets should be stored separately. Helmet use- Always wear a helmet when riding a motorcycle, bicycle, rollerblading or skateboarding. Safe sex- If you may be exposed to a sexually transmitted infection, use a condom Seat belts- Seat bels can save your life; always wear one. Smoke/Carbon Monoxide detectors- These detectors need to be installed on the appropriate level of your home.  Replace batteries at least once a year. Skin Cancer- When out in the sun, cover up and use sunscreen SPF 15 or higher. Violence- If anyone is threatening or hurting you, please tell your healthcare provider.

## 2020-12-09 NOTE — Progress Notes (Signed)
Pt presents to establish care, pt reports periods of fatigue, does have hx of anemia

## 2021-01-14 NOTE — Progress Notes (Signed)
Patient ID: Carly Bullock, female    DOB: 02-Dec-1995  MRN: [id number]  CC: Annual Physical Exam   Subjective: Carly Bullock is a 25 y.o. female who presents for annual physical exam.   Her concerns today include:  Excessive scalp dandruff even after shampooing.   Patient Active Problem List   Diagnosis Date Noted   Pneumonia due to COVID-19 virus 11/12/2019   Sepsis ([place]) 11/12/2019   Nausea and vomiting 11/12/2019   Symptomatic anemia 11/12/2019   Menorrhagia 11/12/2019   Pneumonia 11/12/2019     Current Outpatient Medications on File Prior to Visit  Medication Sig Dispense Refill   ascorbic acid (VITAMIN C) 500 MG tablet Take 1 tablet (500 mg total) by mouth daily. 30 tablet 0   ferrous sulfate 325 (65 FE) MG tablet Take 1 tablet (325 mg total) by mouth daily with breakfast. 30 tablet 6   ibuprofen (ADVIL) 800 MG tablet Take 1 tablet (800 mg total) by mouth every 8 (eight) hours as needed. 30 tablet 3   Vitamin D, Ergocalciferol, (DRISDOL) 1.25 MG (50000 UNIT) CAPS capsule Take 1 capsule (50,000 Units total) by mouth every 7 (seven) days. 5 capsule 6   No current facility-administered medications on file prior to visit.    No Known Allergies  Social History   Socioeconomic History   Marital status: Single    Spouse name: Not on file   Number of children: Not on file   Years of education: Not on file   Highest education level: Not on file  Occupational History   Not on file  Tobacco Use   Smoking status: Never   Smokeless tobacco: Never  Vaping Use   Vaping Use: Former  Substance and Sexual Activity   Alcohol use: Yes    Alcohol/week: 2.0 standard drinks    Types: 2 Shots of liquor per week    Comment: Socially   Drug use: Not Currently   Sexual activity: Yes  Other Topics Concern   Not on file  Social History Narrative   Not on file   Social Determinants of Health   Financial Resource Strain: Not on file  Food Insecurity: Not on file   Transportation Needs: Not on file  Physical Activity: Not on file  Stress: Not on file  Social Connections: Not on file  Intimate Partner Violence: Not on file    Family History  Problem Relation Age of Onset   Breast cancer Mother    Breast cancer Maternal Aunt    Diabetes Maternal Aunt    Diabetes Maternal Uncle    Prostate cancer Father     No past surgical history on file.  ROS: Review of Systems Negative except as stated above  PHYSICAL EXAM: BP 114/75 (BP Location: Right Arm, Patient Position: Sitting, Cuff Size: Large)   Pulse 78   Resp 16   Ht '5\' 6"'  (1.676 m)   Wt 247 lb (112 kg)   LMP 12/25/2020 (Approximate)   SpO2 99%   BMI 39.87 kg/m   Physical Exam Exam conducted with a chaperone present.  HENT:     Head: Normocephalic and atraumatic.     Right Ear: Tympanic membrane, ear canal and external ear normal.     Left Ear: Tympanic membrane, ear canal and external ear normal.  Eyes:     Extraocular Movements: Extraocular movements intact.     Conjunctiva/sclera: Conjunctivae normal.     Pupils: Pupils are equal, round, and reactive to light.  Cardiovascular:  Rate and Rhythm: Normal rate and regular rhythm.     Pulses: Normal pulses.     Heart sounds: Normal heart sounds.  Pulmonary:     Effort: Pulmonary effort is normal.     Breath sounds: Normal breath sounds.  Chest:     Comments: Patient declined exam.  Abdominal:     General: Bowel sounds are normal.     Palpations: Abdomen is soft.  Genitourinary:    General: Normal vulva.     Vagina: Normal.     Cervix: Normal.     Uterus: Normal.      Adnexa: Right adnexa normal and left adnexa normal.     Comments: Serosanguineous fluid present in vaginal canal. Carly Goodpasture, RN present during exam.  Musculoskeletal:        General: Normal range of motion.     Cervical back: Normal range of motion and neck supple.  Skin:    General: Skin is warm and dry.     Capillary Refill: Capillary refill  takes less than 2 seconds.  Neurological:     General: No focal deficit present.     Mental Status: She is alert and oriented to person, place, and time.  Psychiatric:        Mood and Affect: Mood normal.        Behavior: Behavior normal.    ASSESSMENT AND PLAN: 1. Annual physical exam: - Counseled on 150 minutes of exercise per week as tolerated, healthy eating (including decreased daily intake of saturated fats, cholesterol, added sugars, sodium), STI prevention, and routine healthcare maintenance.  2. Screening for metabolic disorder: - ZPH15+AVWP to check kidney function, liver function, and electrolyte balance.  - CMP14+EGFR  3. Screening for deficiency anemia: - CBC to screen for anemia. - CBC  4. Diabetes mellitus screening: - Hemoglobin A1c to screen for pre-diabetes/diabetes. - Hemoglobin A1c  5. Screening cholesterol level: - Lipid panel to screen for high cholesterol.  - Lipid panel  6. Thyroid disorder screen: - TSH to check thyroid function.  - TSH  7. Need for hepatitis C screening test: - Hepatitis C antibody to screen for hepatitis C.  - Hepatitis C Antibody  8. Pap smear for cervical cancer screening: - Cytology - PAP for cervical cancer screening.  - Cytology - PAP([place])  9. Routine screening for STI (sexually transmitted infection): - Cervicovaginal self-swab to screen for chlamydia, gonorrhea, trichomonas, bacterial vaginitis, and candida vaginitis. - Cervicovaginal ancillary only  10. Dandruff in adult: - Referral to Dermatology for further evaluation and management.  - Ambulatory referral to Dermatology    Patient was given the opportunity to ask questions.  Patient verbalized understanding of the plan and was able to repeat key elements of the plan. Patient was given clear instructions to go to Emergency Department or return to medical center if symptoms don't improve, worsen, or new problems develop.The patient verbalized  understanding.   Orders Placed This Encounter  Procedures   Hepatitis C Antibody   CBC   Lipid panel   TSH   CMP14+EGFR   Hemoglobin A1c   Ambulatory referral to Dermatology     Return in about 1 year (around 01/18/2022) for Physical per patient preference.  Camillia Herter, NP

## 2021-01-18 ENCOUNTER — Other Ambulatory Visit: Payer: Self-pay

## 2021-01-18 ENCOUNTER — Encounter: Payer: Self-pay | Admitting: Family

## 2021-01-18 ENCOUNTER — Other Ambulatory Visit (HOSPITAL_COMMUNITY)
Admission: RE | Admit: 2021-01-18 | Discharge: 2021-01-18 | Disposition: A | Discharge status: Home / Self Care | Payer: Self-pay | Source: Ambulatory Visit | Attending: Family | Admitting: Family

## 2021-01-18 ENCOUNTER — Ambulatory Visit (INDEPENDENT_AMBULATORY_CARE_PROVIDER_SITE_OTHER): Payer: Self-pay | Admitting: Family

## 2021-01-18 VITALS — BP 114/75 | HR 78 | Resp 16 | Ht 66.0 in | Wt 247.0 lb

## 2021-01-18 DIAGNOSIS — Z1159 Encounter for screening for other viral diseases: Secondary | ICD-10-CM

## 2021-01-18 DIAGNOSIS — Z1329 Encounter for screening for other suspected endocrine disorder: Secondary | ICD-10-CM

## 2021-01-18 DIAGNOSIS — Z13228 Encounter for screening for other metabolic disorders: Secondary | ICD-10-CM | POA: Diagnosis not present

## 2021-01-18 DIAGNOSIS — Z113 Encounter for screening for infections with a predominantly sexual mode of transmission: Secondary | ICD-10-CM | POA: Insufficient documentation

## 2021-01-18 DIAGNOSIS — L210 Seborrhea capitis: Secondary | ICD-10-CM

## 2021-01-18 DIAGNOSIS — Z130 Encounter for screening for diseases of the blood and blood-forming organs and certain disorders involving the immune mechanism: Secondary | ICD-10-CM | POA: Diagnosis not present

## 2021-01-18 DIAGNOSIS — Z124 Encounter for screening for malignant neoplasm of cervix: Secondary | ICD-10-CM

## 2021-01-18 DIAGNOSIS — Z0000 Encounter for general adult medical examination without abnormal findings: Secondary | ICD-10-CM

## 2021-01-18 DIAGNOSIS — Z13220 Encounter for screening for lipoid disorders: Secondary | ICD-10-CM

## 2021-01-18 DIAGNOSIS — Z131 Encounter for screening for diabetes mellitus: Secondary | ICD-10-CM | POA: Diagnosis not present

## 2021-01-18 DIAGNOSIS — Z0001 Encounter for general adult medical examination with abnormal findings: Secondary | ICD-10-CM

## 2021-01-18 NOTE — Progress Notes (Signed)
Physical with PAP 

## 2021-01-18 NOTE — Patient Instructions (Signed)
Preventive Care 21-27 Years Old, Female Preventive care refers to lifestyle choices and visits with your health care provider that can promote health and wellness. This includes: A yearly physical exam. This is also called an annual wellness visit. Regular dental and eye exams. Immunizations. Screening for certain conditions. Healthy lifestyle choices, such as: Eating a healthy diet. Getting regular exercise. Not using drugs or products that contain nicotine and tobacco. Limiting alcohol use. What can I expect for my preventive care visit? Physical exam Your health care provider may check your: Height and weight. These may be used to calculate your BMI (body mass index). BMI is a measurement that tells if you are at a healthy weight. Heart rate and blood pressure. Body temperature. Skin for abnormal spots. Counseling Your health care provider may ask you questions about your: Past medical problems. Family's medical history. Alcohol, tobacco, and drug use. Emotional well-being. Home life and relationship well-being. Sexual activity. Diet, exercise, and sleep habits. Work and work environment. Access to firearms. Method of birth control. Menstrual cycle. Pregnancy history. What immunizations do I need? Vaccines are usually given at various ages, according to a schedule. Your health care provider will recommend vaccines for you based on your age, medical history, and lifestyle or other factors, such as travel or where you work. What tests do I need? Blood tests Lipid and cholesterol levels. These may be checked every 5 years starting at age 20. Hepatitis C test. Hepatitis B test. Screening Diabetes screening. This is done by checking your blood sugar (glucose) after you have not eaten for a while (fasting). STD (sexually transmitted disease) testing, if you are at risk. BRCA-related cancer screening. This may be done if you have a family history of breast, ovarian, tubal, or  peritoneal cancers. Pelvic exam and Pap test. This may be done every 3 years starting at age 21. Starting at age 30, this may be done every 5 years if you have a Pap test in combination with an HPV test. Talk with your health care provider about your test results, treatment options, and if necessary, the need for more tests. Follow these instructions at home: Eating and drinking  Eat a healthy diet that includes fresh fruits and vegetables, whole grains, lean protein, and low-fat dairy products. Take vitamin and mineral supplements as recommended by your health care provider. Do not drink alcohol if: Your health care provider tells you not to drink. You are pregnant, may be pregnant, or are planning to become pregnant. If you drink alcohol: Limit how much you have to 0-1 drink a day. Be aware of how much alcohol is in your drink. In the [place] one drink equals one 12 oz bottle of beer (355 mL), one 5 oz glass of wine (148 mL), or one 1 oz glass of hard liquor (44 mL). Lifestyle Take daily care of your teeth and gums. Brush your teeth every morning and night with fluoride toothpaste. Floss one time each day. Stay active. Exercise for at least 30 minutes 5 or more days each week. Do not use any products that contain nicotine or tobacco, such as cigarettes, e-cigarettes, and chewing tobacco. If you need help quitting, ask your health care provider. Do not use drugs. If you are sexually active, practice safe sex. Use a condom or other form of protection to prevent STIs (sexually transmitted infections). If you do not wish to become pregnant, use a form of birth control. If you plan to become pregnant, see your health care provider   for a prepregnancy visit. Find healthy ways to cope with stress, such as: Meditation, yoga, or listening to music. Journaling. Talking to a trusted person. Spending time with friends and family. Safety Always wear your seat belt while driving or riding in a  vehicle. Do not drive: If you have been drinking alcohol. Do not ride with someone who has been drinking. When you are tired or distracted. While texting. Wear a helmet and other protective equipment during sports activities. If you have firearms in your house, make sure you follow all gun safety procedures. Seek help if you have been physically or sexually abused. What's next? Go to your health care provider once a year for an annual wellness visit. Ask your health care provider how often you should have your eyes and teeth checked. Stay up to date on all vaccines. This information is not intended to replace advice given to you by your health care provider. Make sure you discuss any questions you have with your health care provider. Document Revised: 05/14/2020 Document Reviewed: 11/15/2017 Elsevier Patient Education  2022 Elsevier Inc.  

## 2021-01-19 ENCOUNTER — Other Ambulatory Visit: Payer: Self-pay | Admitting: Family

## 2021-01-19 ENCOUNTER — Other Ambulatory Visit (HOSPITAL_COMMUNITY): Payer: Self-pay

## 2021-01-19 DIAGNOSIS — R7303 Prediabetes: Secondary | ICD-10-CM | POA: Insufficient documentation

## 2021-01-19 DIAGNOSIS — D509 Iron deficiency anemia, unspecified: Secondary | ICD-10-CM

## 2021-01-19 DIAGNOSIS — A749 Chlamydial infection, unspecified: Secondary | ICD-10-CM | POA: Insufficient documentation

## 2021-01-19 DIAGNOSIS — N921 Excessive and frequent menstruation with irregular cycle: Secondary | ICD-10-CM

## 2021-01-19 DIAGNOSIS — B9689 Other specified bacterial agents as the cause of diseases classified elsewhere: Secondary | ICD-10-CM | POA: Insufficient documentation

## 2021-01-19 DIAGNOSIS — N760 Acute vaginitis: Secondary | ICD-10-CM | POA: Insufficient documentation

## 2021-01-19 LAB — CBC
Hematocrit: 28.6 % — ABNORMAL LOW (ref 34.0–46.6)
Hemoglobin: 7.6 g/dL — ABNORMAL LOW (ref 11.1–15.9)
MCH: 14.9 pg — ABNORMAL LOW (ref 26.6–33.0)
MCHC: 26.6 g/dL — ABNORMAL LOW (ref 31.5–35.7)
MCV: 56 fL — ABNORMAL LOW (ref 79–97)
Platelets: 279 10*3/uL (ref 150–450)
RBC: 5.1 x10E6/uL (ref 3.77–5.28)
RDW: 23.1 % — ABNORMAL HIGH (ref 11.7–15.4)
WBC: 5.5 10*3/uL (ref 3.4–10.8)

## 2021-01-19 LAB — CMP14+EGFR
ALT: 18 IU/L (ref 0–32)
AST: 21 IU/L (ref 0–40)
Albumin/Globulin Ratio: 1.3 (ref 1.2–2.2)
Albumin: 4.4 g/dL (ref 3.9–5.0)
Alkaline Phosphatase: 75 IU/L (ref 44–121)
BUN/Creatinine Ratio: 9 (ref 9–23)
BUN: 7 mg/dL (ref 6–20)
Bilirubin Total: 0.3 mg/dL (ref 0.0–1.2)
CO2: 23 mmol/L (ref 20–29)
Calcium: 9.7 mg/dL (ref 8.7–10.2)
Chloride: 100 mmol/L (ref 96–106)
Creatinine, Ser: 0.74 mg/dL (ref 0.57–1.00)
Globulin, Total: 3.3 g/dL (ref 1.5–4.5)
Glucose: 76 mg/dL (ref 70–99)
Potassium: 4.7 mmol/L (ref 3.5–5.2)
Sodium: 136 mmol/L (ref 134–144)
Total Protein: 7.7 g/dL (ref 6.0–8.5)
eGFR: 114 mL/min/{1.73_m2} (ref >59)

## 2021-01-19 LAB — CERVICOVAGINAL ANCILLARY ONLY
Bacterial Vaginitis (gardnerella): POSITIVE — AB
Candida Glabrata: NEGATIVE
Candida Vaginitis: NEGATIVE
Chlamydia: POSITIVE — AB
Comment: NEGATIVE
Comment: NEGATIVE
Comment: NEGATIVE
Comment: NEGATIVE
Comment: NEGATIVE
Comment: NORMAL
Neisseria Gonorrhea: NEGATIVE
Trichomonas: NEGATIVE

## 2021-01-19 LAB — HEMOGLOBIN A1C
Est. average glucose Bld gHb Est-mCnc: 117 mg/dL
Hgb A1c MFr Bld: 5.7 % — ABNORMAL HIGH (ref 4.8–5.6)

## 2021-01-19 LAB — LIPID PANEL
Chol/HDL Ratio: 3.1 ratio (ref 0.0–4.4)
Cholesterol, Total: 134 mg/dL (ref 100–199)
HDL: 43 mg/dL (ref >39)
LDL Chol Calc (NIH): 73 mg/dL (ref 0–99)
Triglycerides: 96 mg/dL (ref 0–149)
VLDL Cholesterol Cal: 18 mg/dL (ref 5–40)

## 2021-01-19 LAB — TSH: TSH: 1.22 u[IU]/mL (ref 0.450–4.500)

## 2021-01-19 LAB — HEPATITIS C ANTIBODY: Hep C Virus Ab: <0.1 s/co ratio (ref 0.0–0.9)

## 2021-01-19 MED ORDER — FERROUS SULFATE 325 (65 FE) MG PO TABS
325.0000 mg | ORAL_TABLET | Freq: Two times a day (BID) | ORAL | 0 refills | Status: DC
  Filled 2021-01-19: qty 240, 120d supply, fill #0

## 2021-01-19 MED ORDER — METRONIDAZOLE 500 MG PO TABS
500.0000 mg | ORAL_TABLET | Freq: Two times a day (BID) | ORAL | 0 refills | Status: DC
  Filled 2021-01-19: qty 14, 7d supply, fill #0

## 2021-01-19 MED ORDER — DOXYCYCLINE HYCLATE 100 MG PO TABS
100.0000 mg | ORAL_TABLET | Freq: Two times a day (BID) | ORAL | 0 refills | Status: DC
  Filled 2021-01-19: qty 14, 7d supply, fill #0

## 2021-01-19 NOTE — Progress Notes (Signed)
Kidney function normal.   Liver function normal.   Thyroid function normal.   Cholesterol normal.   Hepatitis C negative.   Hemoglobin A1c is consistent with pre-diabetes. Practice healthy eating habits of fresh fruit and vegetables, lean baked meats such as chicken, fish, and [place]; limit breads, rice, pastas, and desserts; practice regular aerobic exercise (at least 150 minutes a week as tolerated). No medication needed at the moment. Encouraged to recheck hemoglobin A1c in 6 months or sooner if needed.   Anemia continuing since 2 years ago. Increase Ferrous Sulfate to twice daily (prune or pear juice may be helpful for possible constipation caused by Ferrous Sulfate).  Increase intake of iron-rich foods such as green leafy vegetables. Referral to Gynecology for further evaluation and management of irregular periods with heavy bleeding which may be contributing to anemia. Their office should call patient within 2 weeks with appointment details. Follow-up with primary provider as scheduled.   Gonorrhea,Trichomonas, and Candida Vaginitis (sometimes called yeast infection) negative.   Positive for Chlamydia which is a sexually transmitted infection. Both patient and her partner need to be treated for Chlamydia. Also, both need to complete treatment prior to having unprotected sex again. Do not cosume alcohol while taking prescribed antibiotic as this may causes severe nausea/vomiting and upset stomach.   Bacterial Vaginitis, an overgrowth of normal bacteria in the vagina due to changes in pH. Prescribed Metronidazole (Flagyl) twice per day for 7 days. Do not drink alcohol while taking this medication as this may cause severe nausea, vomiting, and upset stomach.

## 2021-01-20 ENCOUNTER — Telehealth: Payer: Self-pay | Admitting: Family

## 2021-01-20 ENCOUNTER — Other Ambulatory Visit: Payer: Self-pay

## 2021-01-20 DIAGNOSIS — D509 Iron deficiency anemia, unspecified: Secondary | ICD-10-CM

## 2021-01-20 DIAGNOSIS — B9689 Other specified bacterial agents as the cause of diseases classified elsewhere: Secondary | ICD-10-CM

## 2021-01-20 DIAGNOSIS — A749 Chlamydial infection, unspecified: Secondary | ICD-10-CM

## 2021-01-20 MED ORDER — FERROUS SULFATE 325 (65 FE) MG PO TABS: 325.0000 mg | ORAL_TABLET | Freq: Two times a day (BID) | ORAL | 0 refills | Status: DC

## 2021-01-20 MED ORDER — DOXYCYCLINE HYCLATE 100 MG PO TABS: 100.0000 mg | ORAL_TABLET | Freq: Two times a day (BID) | ORAL | 0 refills | Status: AC

## 2021-01-20 MED ORDER — METRONIDAZOLE 500 MG PO TABS: 500.0000 mg | ORAL_TABLET | Freq: Two times a day (BID) | ORAL | 0 refills | Status: AC

## 2021-01-20 NOTE — Telephone Encounter (Signed)
Spoke w/pt about lab results, prescriptions were resent to [facility] as the initial rx was sent to [facility] pharmacy

## 2021-01-21 LAB — CYTOLOGY - PAP
Comment: NEGATIVE
Diagnosis: NEGATIVE
Diagnosis: REACTIVE
High risk HPV: NEGATIVE

## 2021-01-21 NOTE — Progress Notes (Signed)
PAP smear negative for lesion and malignancy. HPV negative. Repeat PAP in 3 years or sooner if needed.

## 2021-02-11 ENCOUNTER — Encounter: Payer: Self-pay | Admitting: Family

## 2021-02-27 NOTE — Progress Notes (Signed)
Patient ID: Carly Bullock, female    DOB: [birth year]  MRN: [id number]  CC: Vaginal Discharge   Subjective: Carly Bullock is a 25 y.o. female who presents for vaginal discharge.   Her concerns today include:  Since last appointment completed antibiotics. Denies vaginal odor. Still having vaginal discharge alternating between bloody and clear. LMP, November 2022. Has not heard from Gynecology referral as of present.   Concern for right-sided back pain for 2 weeks. Denies injury and/or trauma. Initially thought related to the way she was sleeping during nighttime. Worsened a few days ago after hearing a pop while stretching. Squeezing pain radiating to the right flank, tender to the touch, and worse with sitting for extended periods. Taking over-the-counter Ibuprofen with some relief. 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    Patient Active Problem List   Diagnosis Date Noted   Prediabetes 01/19/2021   Chlamydia 01/19/2021   Bacterial vaginitis 01/19/2021   Pneumonia due to COVID-19 virus 11/12/2019   Sepsis ([facility]) 11/12/2019   Nausea and vomiting 11/12/2019   Symptomatic anemia 11/12/2019   Menorrhagia 11/12/2019   Pneumonia 11/12/2019     Current Outpatient Medications on File Prior to Visit  Medication Sig Dispense Refill   ascorbic acid (VITAMIN C) 500 MG tablet Take 1 tablet (500 mg total) by mouth daily. 30 tablet 0   ferrous sulfate 325 (65 FE) MG tablet Take 1 tablet (325 mg total) by mouth 2 (two) times daily with a meal. 240 tablet 0   ibuprofen (ADVIL) 800 MG tablet Take 1 tablet (800 mg total) by mouth every 8 (eight) hours as needed. 30 tablet 3   Vitamin D, Ergocalciferol, (DRISDOL) 1.25 MG (50000 UNIT) CAPS capsule Take 1 capsule (50,000 Units total) by mouth every 7 (seven) days. 5 capsule 6   No current facility-administered medications on file prior to visit.    No Known Allergies  Social History   Socioeconomic  History   Marital status: Single    Spouse name: Not on file   Number of children: Not on file   Years of education: Not on file   Highest education level: Not on file  Occupational History   Not on file  Tobacco Use   Smoking status: Never   Smokeless tobacco: Never  Vaping Use   Vaping Use: Former  Substance and Sexual Activity   Alcohol use: Yes    Alcohol/week: 2.0 standard drinks    Types: 2 Shots of liquor per week    Comment: Socially   Drug use: Not Currently   Sexual activity: Yes  Other Topics Concern   Not on file  Social History Narrative   Not on file   Social Determinants of Health   Financial Resource Strain: Not on file  Food Insecurity: Not on file  Transportation Needs: Not on file  Physical Activity: Not on file  Stress: Not on file  Social Connections: Not on file  Intimate Partner Violence: Not on file    Family History  Problem Relation Age of Onset   Breast cancer Mother    Breast cancer Maternal Aunt    Diabetes Maternal Aunt    Diabetes Maternal Uncle    Prostate cancer Father     No past surgical history on file.  ROS: Review of Systems Negative except as stated above  PHYSICAL EXAM: BP 126/83 (BP Location: Left Arm, Patient Position: Sitting, Cuff Size: Normal)   Pulse 94  Temp 98.2 F (36.8 C)   Resp 18   Ht 5' 5.98" (1.676 m)   Wt 247 lb (112 kg)   SpO2 99%   BMI 39.89 kg/m    Physical Exam HENT:     Head: Normocephalic and atraumatic.  Eyes:     Extraocular Movements: Extraocular movements intact.     Conjunctiva/sclera: Conjunctivae normal.     Pupils: Pupils are equal, round, and reactive to light.  Cardiovascular:     Rate and Rhythm: Normal rate and regular rhythm.     Pulses: Normal pulses.     Heart sounds: Normal heart sounds.  Pulmonary:     Effort: Pulmonary effort is normal.     Breath sounds: Normal breath sounds.  Musculoskeletal:     Cervical back: Normal range of motion and neck supple.      Thoracic back: Tenderness present.  Neurological:     General: No focal deficit present.     Mental Status: She is alert and oriented to person, place, and time.  Psychiatric:        Mood and Affect: Mood normal.        Behavior: Behavior normal.   ASSESSMENT AND PLAN: 1. Vaginal discharge: - Cervicovaginal self-swab to screen for chlamydia, gonorrhea, trichomonas, bacterial vaginitis, and candida vaginitis. - Cervicovaginal ancillary only  2. Menorrhagia with irregular cycle: - Referral to Gynecology for further evaluation and management.  - Ambulatory referral to Gynecology  3. Acute right-sided thoracic back pain: - Diagnostic x-ray thoracic spine for further evaluation.  - Cyclobenzaprine as prescribed. Counseled on medication compliance and adverse effects. May cause drowsiness. Counseled patient to not consume if operating heavy machinery or driving. Counseled patient to not consume with alcohol or illicit substances. Patient verbalized understanding.  - Continue over-the-counter Ibuprofen.  - Follow-up with primary provider in 4 weeks or sooner if needed.  - DG Thoracic Spine W/Swimmers; Future - cyclobenzaprine (FLEXERIL) 5 MG tablet; Take 1 tablet (5 mg total) by mouth 3 (three) times daily as needed for muscle spasms.  Dispense: 30 tablet; Refill: 0  4. Encounter for completion of form with patient: - Counseled will need a paperwork appointment for completion and to schedule when best for her, patient agreeable.     Patient was given the opportunity to ask questions.  Patient verbalized understanding of the plan and was able to repeat key elements of the plan. Patient was given clear instructions to go to Emergency Department or return to medical center if symptoms don't improve, worsen, or new problems develop.The patient verbalized understanding.   Orders Placed This Encounter  Procedures   DG Thoracic Spine W/Swimmers   Ambulatory referral to Gynecology      Requested Prescriptions   Signed Prescriptions Disp Refills   cyclobenzaprine (FLEXERIL) 5 MG tablet 30 tablet 0    Sig: Take 1 tablet (5 mg total) by mouth 3 (three) times daily as needed for muscle spasms.    Return in about 4 weeks (around 04/01/2021) for Follow-Up or next available back pain and when best for paperwork completion.  Rema Fendt, NP

## 2021-03-04 ENCOUNTER — Other Ambulatory Visit (HOSPITAL_COMMUNITY)
Admission: RE | Admit: 2021-03-04 | Discharge: 2021-03-04 | Disposition: A | Discharge status: Home / Self Care | Payer: Self-pay | Source: Ambulatory Visit | Attending: Family | Admitting: Family

## 2021-03-04 ENCOUNTER — Ambulatory Visit (INDEPENDENT_AMBULATORY_CARE_PROVIDER_SITE_OTHER): Payer: Self-pay

## 2021-03-04 ENCOUNTER — Ambulatory Visit (INDEPENDENT_AMBULATORY_CARE_PROVIDER_SITE_OTHER): Payer: Self-pay | Admitting: Family

## 2021-03-04 ENCOUNTER — Other Ambulatory Visit: Payer: Self-pay

## 2021-03-04 ENCOUNTER — Encounter: Payer: Self-pay | Admitting: Family

## 2021-03-04 VITALS — BP 126/83 | HR 94 | Temp 98.2°F | Resp 18 | Ht 65.98 in | Wt 247.0 lb

## 2021-03-04 DIAGNOSIS — Z0289 Encounter for other administrative examinations: Secondary | ICD-10-CM

## 2021-03-04 DIAGNOSIS — M546 Pain in thoracic spine: Secondary | ICD-10-CM

## 2021-03-04 DIAGNOSIS — N921 Excessive and frequent menstruation with irregular cycle: Secondary | ICD-10-CM

## 2021-03-04 DIAGNOSIS — N898 Other specified noninflammatory disorders of vagina: Secondary | ICD-10-CM | POA: Insufficient documentation

## 2021-03-04 MED ORDER — CYCLOBENZAPRINE HCL 5 MG PO TABS: 5.0000 mg | ORAL_TABLET | Freq: Three times a day (TID) | ORAL | 0 refills | Status: DC | PRN

## 2021-03-04 NOTE — Progress Notes (Signed)
Pt presents for vaginal discharge

## 2021-03-07 ENCOUNTER — Other Ambulatory Visit: Payer: Self-pay | Admitting: Family

## 2021-03-07 DIAGNOSIS — M4185 Other forms of scoliosis, thoracolumbar region: Secondary | ICD-10-CM

## 2021-03-07 NOTE — Progress Notes (Signed)
Scoliosis of back. Referral to Orthopedics for further evaluation and management. Their office should call patient within 2 weeks with appointment details.

## 2021-03-08 LAB — CERVICOVAGINAL ANCILLARY ONLY
Bacterial Vaginitis (gardnerella): NEGATIVE
Candida Glabrata: NEGATIVE
Candida Vaginitis: NEGATIVE
Chlamydia: NEGATIVE
Comment: NEGATIVE
Comment: NEGATIVE
Comment: NEGATIVE
Comment: NEGATIVE
Comment: NEGATIVE
Comment: NORMAL
Neisseria Gonorrhea: NEGATIVE
Trichomonas: NEGATIVE

## 2021-03-08 NOTE — Progress Notes (Signed)
Gonorrhea, Chlamydia, Trichomonas, Bacterial Vaginitis, and Candida Vaginitis (sometimes called a yeast infection) negative.

## 2021-03-10 ENCOUNTER — Ambulatory Visit (INDEPENDENT_AMBULATORY_CARE_PROVIDER_SITE_OTHER): Payer: Self-pay | Admitting: Surgery

## 2021-03-10 ENCOUNTER — Other Ambulatory Visit: Payer: Self-pay

## 2021-03-10 ENCOUNTER — Encounter: Payer: Self-pay | Admitting: Surgery

## 2021-03-10 VITALS — BP 117/79 | HR 98 | Ht 65.98 in | Wt 247.0 lb

## 2021-03-10 DIAGNOSIS — M545 Low back pain, unspecified: Secondary | ICD-10-CM

## 2021-03-10 DIAGNOSIS — S29012A Strain of muscle and tendon of back wall of thorax, initial encounter: Secondary | ICD-10-CM

## 2021-03-10 MED ORDER — METHYLPREDNISOLONE ACETATE 80 MG/ML IJ SUSP: 80.0000 mg | Freq: Once | INTRAMUSCULAR | Status: AC

## 2021-03-10 MED ORDER — KETOROLAC TROMETHAMINE 30 MG/ML IJ SOLN: 30.0000 mg | Freq: Once | INTRAMUSCULAR | Status: AC

## 2021-03-10 NOTE — Progress Notes (Signed)
Per Zonia Kief, patient was given Toradol, 30mg  into the right glute and Depo, 80mg  into the left glute.  Patient tolerated both injections.  Advised to call the office with any questions or concerns.

## 2021-03-10 NOTE — Progress Notes (Signed)
Office Visit Note   Patient: Carly Bullock           Date of Birth: 1995/10/08           MRN: [id number] Visit Date: 03/10/2021              Requested by: Rema Fendt, NP [address] [place],  [place] [address] PCP: Rema Fendt, NP   Assessment & Plan: Visit Diagnoses:  1. Strain of thoracic spine     Plan: This point for patient's pain recommend conservative management.  She does state that she is about 90% better.  Today she was given a Depo-Medrol 80 mg and Toradol 30 mg IM injections.  I encouraged her to do some gentle stretching exercises and use a heating pad off-and-on as needed.  Follow-up with me in 3 weeks for recheck.  I did asked patient to call me in 1 week to let me know how she is feeling.  If she has not had any improvement I may consider sending her to formal therapy.  All questions answered.  Follow-Up Instructions: Return in about 3 weeks (around 03/31/2021) for with Evalynn Hankins for recheck thoracic strain.   Orders:  No orders of the defined types were placed in this encounter.  Meds ordered this encounter  Medications   ketorolac (TORADOL) 30 MG/ML injection 30 mg   methylPREDNISolone acetate (DEPO-MEDROL) injection 80 mg      Procedures: No procedures performed   Clinical Data: No additional findings.   Subjective: Chief Complaint  Patient presents with   Lower Back - Pain    HPI 25 year old black female who is new patient to clinic was referred by her primary care nurse practitioner for scoliosis.  Patient states that 2 weeks ago she woke up and stretched her arms overhead and felt a pain in the right thoracolumbar region.  No previous problems of this nature before onset.  When it first happened states that it was hard for her to stand or sit for a long time.  She has had some improvement and states that she is about 90% better.  Taking Aleve as needed along with a muscle relaxer.  Denies lower extremity radiculopathy or bowel or  bladder incontinence. Review of Systems No current cardiac pulmonary GI GU issues  Objective: Vital Signs: BP 117/79    Pulse 98    Ht 5' 5.98" (1.676 m)    Wt 247 lb (112 kg)    BMI 39.89 kg/m   Physical Exam HENT:     Head: Normocephalic and atraumatic.     Nose: Nose normal.  Eyes:     Extraocular Movements: Extraocular movements intact.  Pulmonary:     Effort: No respiratory distress.  Musculoskeletal:     Comments: Gait is normal.  Cervical spine unremarkable.   mild right thoracolumbar paraspinal tenderness.  Negative logroll bilateral hips.  Negative straight leg raise.  No focal motor deficits.  Neurological:     Mental Status: She is alert.  Psychiatric:        Mood and Affect: Mood normal.    Ortho Exam  Specialty Comments:  No specialty comments available.  Imaging: No results found.   PMFS History: Patient Active Problem List   Diagnosis Date Noted   Prediabetes 01/19/2021   Chlamydia 01/19/2021   Bacterial vaginitis 01/19/2021   Pneumonia due to COVID-19 virus 11/12/2019   Sepsis (HCC) 11/12/2019   Nausea and vomiting 11/12/2019   Symptomatic anemia 11/12/2019  Menorrhagia 11/12/2019   Pneumonia 11/12/2019   Past Medical History:  Diagnosis Date   Anemia    Anxiety 11/2019   Coronavirus infection 11/12/2019   Decreased ferritin 11/2019   Iron deficiency anemia 11/2019   Microcytic anemia 11/2019   Vitamin D deficiency 11/2019    Family History  Problem Relation Age of Onset   Breast cancer Mother    Breast cancer Maternal Aunt    Diabetes Maternal Aunt    Diabetes Maternal Uncle    Prostate cancer Father     History reviewed. No pertinent surgical history. Social History   Occupational History   Not on file  Tobacco Use   Smoking status: Never   Smokeless tobacco: Never  Vaping Use   Vaping Use: Former  Substance and Sexual Activity   Alcohol use: Yes    Alcohol/week: 2.0 standard drinks    Types: 2 Shots of liquor per week     Comment: Socially   Drug use: Not Currently   Sexual activity: Yes

## 2021-03-15 NOTE — Progress Notes (Signed)
Patient ID: Carly Bullock, female    DOB: 1996-02-10  MRN: [id number]  CC: Paperwork  Subjective: Carly Bullock is a 25 y.o. female who presents for paperwork.   Her concerns today include:  Presents with paperwork for completion from her employer [employer]. Reports recent appointment with Orthopedics went well and feels improved. Received steroid injection which helped. Still having some back pain which radiates to right hand.    Patient Active Problem List   Diagnosis Date Noted   Prediabetes 01/19/2021   Chlamydia 01/19/2021   Bacterial vaginitis 01/19/2021   Pneumonia due to COVID-19 virus 11/12/2019   Sepsis (HCC) 11/12/2019   Nausea and vomiting 11/12/2019   Symptomatic anemia 11/12/2019   Menorrhagia 11/12/2019   Pneumonia 11/12/2019     Current Outpatient Medications on File Prior to Visit  Medication Sig Dispense Refill   ascorbic acid (VITAMIN C) 500 MG tablet Take 1 tablet (500 mg total) by mouth daily. 30 tablet 0   cyclobenzaprine (FLEXERIL) 5 MG tablet Take 1 tablet (5 mg total) by mouth 3 (three) times daily as needed for muscle spasms. 30 tablet 0   ferrous sulfate 325 (65 FE) MG tablet Take 1 tablet (325 mg total) by mouth 2 (two) times daily with a meal. 240 tablet 0   ibuprofen (ADVIL) 800 MG tablet Take 1 tablet (800 mg total) by mouth every 8 (eight) hours as needed. 30 tablet 3   Vitamin D, Ergocalciferol, (DRISDOL) 1.25 MG (50000 UNIT) CAPS capsule Take 1 capsule (50,000 Units total) by mouth every 7 (seven) days. 5 capsule 6   Current Facility-Administered Medications on File Prior to Visit  Medication Dose Route Frequency Provider Last Rate Last Admin   methylPREDNISolone acetate (DEPO-MEDROL) injection 80 mg  80 mg Intramuscular Once Naida Sleight, PA-C        No Known Allergies  Social History   Socioeconomic History   Marital status: Single    Spouse name: Not on file   Number of children: Not on file   Years of education: Not on file    Highest education level: Not on file  Occupational History   Not on file  Tobacco Use   Smoking status: Never   Smokeless tobacco: Never  Vaping Use   Vaping Use: Former  Substance and Sexual Activity   Alcohol use: Yes    Alcohol/week: 2.0 standard drinks    Types: 2 Shots of liquor per week    Comment: Socially   Drug use: Not Currently   Sexual activity: Yes  Other Topics Concern   Not on file  Social History Narrative   Not on file   Social Determinants of Health   Financial Resource Strain: Not on file  Food Insecurity: Not on file  Transportation Needs: Not on file  Physical Activity: Not on file  Stress: Not on file  Social Connections: Not on file  Intimate Partner Violence: Not on file    Family History  Problem Relation Age of Onset   Breast cancer Mother    Breast cancer Maternal Aunt    Diabetes Maternal Aunt    Diabetes Maternal Uncle    Prostate cancer Father     No past surgical history on file.  ROS: Review of Systems Negative except as stated above  PHYSICAL EXAM: BP 105/67 (BP Location: Left Arm, Patient Position: Sitting, Cuff Size: Large)    Pulse 100    Temp 98.3 F (36.8 C)    Resp 18  Ht 5' 5.98" (1.676 m)    Wt 249 lb 12.8 oz (113.3 kg)    SpO2 98%    BMI 40.34 kg/m   Physical Exam HENT:     Head: Normocephalic and atraumatic.  Eyes:     Extraocular Movements: Extraocular movements intact.     Conjunctiva/sclera: Conjunctivae normal.     Pupils: Pupils are equal, round, and reactive to light.  Cardiovascular:     Rate and Rhythm: Normal rate and regular rhythm.     Pulses: Normal pulses.     Heart sounds: Normal heart sounds.  Musculoskeletal:     Cervical back: Normal range of motion and neck supple.  Neurological:     General: No focal deficit present.     Mental Status: She is alert and oriented to person, place, and time.  Psychiatric:        Mood and Affect: Mood normal.        Behavior: Behavior normal.    ASSESSMENT AND PLAN: 1. 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 - Keep all scheduled appointments with Orthopedics.   Patient was given the opportunity to ask questions.  Patient verbalized understanding of the plan and was able to repeat key elements of the plan. Patient was given clear instructions to go to Emergency Department or return to medical center if symptoms don't improve, worsen, or new problems develop.The patient verbalized understanding.   Follow-up with primary provider as scheduled.   Rema Fendt, NP

## 2021-03-22 ENCOUNTER — Encounter: Payer: Self-pay | Admitting: Family

## 2021-03-22 ENCOUNTER — Ambulatory Visit (INDEPENDENT_AMBULATORY_CARE_PROVIDER_SITE_OTHER): Payer: 59 | Admitting: Family

## 2021-03-22 ENCOUNTER — Other Ambulatory Visit: Payer: Self-pay

## 2021-03-22 VITALS — BP 105/67 | HR 100 | Temp 98.3°F | Resp 18 | Ht 65.98 in | Wt 249.8 lb

## 2021-03-22 DIAGNOSIS — Z0289 Encounter for other administrative examinations: Secondary | ICD-10-CM | POA: Diagnosis not present

## 2021-03-22 NOTE — Progress Notes (Signed)
Pt presents for completion of paperwork to return back to work

## 2021-03-29 ENCOUNTER — Ambulatory Visit: Payer: Self-pay | Admitting: Family

## 2021-04-12 ENCOUNTER — Ambulatory Visit: Payer: Self-pay | Admitting: Family

## 2021-05-16 ENCOUNTER — Encounter: Payer: Self-pay | Admitting: Obstetrics and Gynecology

## 2021-05-16 ENCOUNTER — Ambulatory Visit: Payer: 59 | Admitting: Obstetrics and Gynecology

## 2021-05-16 ENCOUNTER — Other Ambulatory Visit: Payer: Self-pay

## 2021-05-16 DIAGNOSIS — N926 Irregular menstruation, unspecified: Secondary | ICD-10-CM

## 2021-05-16 NOTE — Progress Notes (Addendum)
° °  CC: irregular cycles, PCOS   Patient ID: Carly Bullock, female    DOB: [birth year], 26 y.o.   MRN: [id number]  HPI 43 you G0 seen for evaluation for irregular menses and possible  PCOS.  She notes in September 2022 her cycles became more irregular.  She was initially spotting for two weeks but then started spotting for the whole months which evolved into passing clots for 2-3 weeks in January.  Pt noted a relatively normal cycle in February from 2/6-2/13.  Only cramps noted.     Review of Systems     Objective:   Physical Exam Vitals:   05/16/21 1344  BP: 121/70  Pulse: (!) 108   Acanthosis nigricans noted on posterior neck      Assessment & Plan:   1. Irregular menses Pt's hx is highly suspicious for PCOS.  Will check hormone panel and get pelvic u/s to eval for structural issues.  While waiting, advise 10 % weight loss which may improve insulin resistance and hopefully get cycles more regular.   If u/s WNL, can discuss initiating OCP for cycle control until pt is ready to attempt pregnancy. - FSH - LH - TSH + free T4 - Beta hCG quant (ref lab) - Prolactin - US PELVIC COMPLETE WITH TRANSVAGINAL; Future  Virtual follow up in 5 weeks  I spent 20 minutes dedicated to the care of this patient including previsit review of records, face to face time with the patient discussing symptoms, treatment plans and post visit testing.   Griffin Basil, MD Faculty Attending, [facility] for [facility]

## 2021-05-16 NOTE — Progress Notes (Signed)
Patient is here due to prolong bleeding during monthly menstrual. She stated that the bleeding would last anywhere from 2 to 3 weeks. This started roughly 2 years ago.   Patient wants to know if there is any type of "testing" we can do for her in regards to prolong bleeding

## 2021-05-17 LAB — PROLACTIN: Prolactin: 18 ng/mL (ref 4.8–23.3)

## 2021-05-17 LAB — LUTEINIZING HORMONE: LH: 11.3 m[IU]/mL

## 2021-05-17 LAB — TSH+FREE T4
Free T4: 1.08 ng/dL (ref 0.82–1.77)
TSH: 0.932 u[IU]/mL (ref 0.450–4.500)

## 2021-05-17 LAB — BETA HCG QUANT (REF LAB): hCG Quant: <1 m[IU]/mL

## 2021-05-17 LAB — FOLLICLE STIMULATING HORMONE: FSH: 4.6 m[IU]/mL

## 2021-05-18 ENCOUNTER — Other Ambulatory Visit: Payer: Self-pay | Admitting: Family

## 2021-05-18 DIAGNOSIS — D509 Iron deficiency anemia, unspecified: Secondary | ICD-10-CM

## 2021-05-23 ENCOUNTER — Other Ambulatory Visit: Payer: Self-pay

## 2021-05-23 ENCOUNTER — Ambulatory Visit (HOSPITAL_COMMUNITY)
Admission: RE | Admit: 2021-05-23 | Discharge: 2021-05-23 | Disposition: A | Discharge status: Home / Self Care | Payer: 59 | Source: Ambulatory Visit | Attending: Obstetrics and Gynecology | Admitting: Obstetrics and Gynecology

## 2021-05-23 DIAGNOSIS — N854 Malposition of uterus: Secondary | ICD-10-CM | POA: Diagnosis not present

## 2021-05-23 DIAGNOSIS — N926 Irregular menstruation, unspecified: Secondary | ICD-10-CM | POA: Diagnosis not present

## 2021-06-21 ENCOUNTER — Encounter: Payer: Self-pay | Admitting: Obstetrics and Gynecology

## 2021-06-21 ENCOUNTER — Telehealth (INDEPENDENT_AMBULATORY_CARE_PROVIDER_SITE_OTHER): Payer: 59 | Admitting: Obstetrics and Gynecology

## 2021-06-21 DIAGNOSIS — N926 Irregular menstruation, unspecified: Secondary | ICD-10-CM | POA: Diagnosis not present

## 2021-06-21 NOTE — Progress Notes (Signed)
? ?TELEHEALTH GYNECOLOGY VISIT ENCOUNTER NOTE ? ?Provider location: [facility]  ? ?Patient location: Home ? ?I connected with Carly Bullock on 06/21/21 at  1:15 PM EDT by telephone and verified that I am speaking with the correct person using two identifiers. Patient was unable to do MyChart audiovisual encounter due to technical difficulties, she tried several times.  ?  ?I discussed the limitations, risks, security and privacy concerns of performing an evaluation and management service by telephone and the availability of in person appointments. I also discussed with the patient that there may be a patient responsible charge related to this service. The patient expressed understanding and agreed to proceed. ?  ?History:  ?Carly Bullock is a 26 y.o. G0P0000 female being evaluated today for follow form her GYN U/S.  See prior office notes. GYN U/S results reviewed with pt. Cycles have become more regular with life style changes.   ?  ?Past Medical History:  ?Diagnosis Date  ? Anemia   ? Anxiety 11/2019  ? Coronavirus infection 11/12/2019  ? Decreased ferritin 11/2019  ? Iron deficiency anemia 11/2019  ? Microcytic anemia 11/2019  ? Vitamin D deficiency 11/2019  ? ?History reviewed. No pertinent surgical history. ?The following portions of the patient's history were reviewed and updated as appropriate: allergies, current medications, past family history, past medical history, past social history, past surgical history and problem list.  ? ? ?Review of Systems:  ?Pertinent items noted in HPI and remainder of comprehensive ROS otherwise negative. ? ?Physical Exam:  ? ?General:  Alert, oriented and cooperative.   ?Mental Status: Normal mood and affect perceived. Normal judgment and thought content.  ?Physical exam deferred due to nature of the encounter ? ?Labs and Imaging ?No results found for this or any previous visit (from the past 336 hour(s)). ?US PELVIC COMPLETE WITH  TRANSVAGINAL ? ?Result Date: 05/23/2021 ?CLINICAL DATA:  Irregular menses, LMP 04/25/2021 EXAM: TRANSABDOMINAL AND TRANSVAGINAL ULTRASOUND OF PELVIS TECHNIQUE: Both transabdominal and transvaginal ultrasound examinations of the pelvis were performed. Transabdominal technique was performed for global imaging of the pelvis including uterus, ovaries, adnexal regions, and pelvic cul-de-sac. It was necessary to proceed with endovaginal exam following the transabdominal exam to visualize the uterus, endometrium, and ovaries. COMPARISON:  None FINDINGS: Uterus Measurements: 7.8 x 5.0 x 5.2 cm = volume: 106 mL. Anteverted. Normal morphology without mass Endometrium Thickness: 8 mm.  No endometrial fluid or mass Right ovary Measurements: 2.6 x 1.9 x 2.2 cm = volume: 5.8 mL. Normal morphology without mass Left ovary Measurements: 2.5 x 1.6 x 2.4 cm = volume: 5.0 mL. Normal morphology without mass Other findings No free pelvic fluid.  No adnexal masses. IMPRESSION: Normal exam. Electronically Signed   By: Ulyses Southward M.D.   On: 05/23/2021 14:46      ?Assessment and Plan:  ?   Irregular menses ?   ?Discussed Tx options for irregular cycles. Pt would like to follow and continue with life style modifications for now. ?I discussed the assessment and treatment plan with the patient. The patient was provided an opportunity to ask questions and all were answered. The patient agreed with the plan and demonstrated an understanding of the instructions. ?  ?The patient was advised to call back or seek an in-person evaluation/go to the ED if the symptoms worsen or if the condition fails to improve as anticipated. ? ?I provided 10 minutes of non-face-to-face time during this encounter. ? ? ?Hermina Staggers,  MD ?[facility], [facility]  ?

## 2021-06-21 NOTE — Patient Instructions (Signed)

## 2022-11-13 IMAGING — US US PELVIS COMPLETE WITH TRANSVAGINAL
1 series · 14 of 25 positions shown · non-contrast
Comparison: None

CLINICAL DATA: Irregular menses, LMP 04/25/2021



[Series 1: us pelvic complete with transvaginal · 14 of 75 slices shown]
[im 1/75]
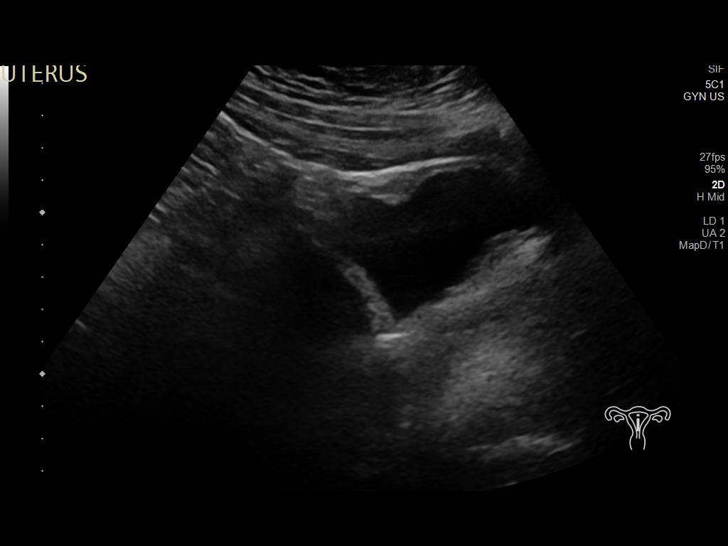
[im 7/75]
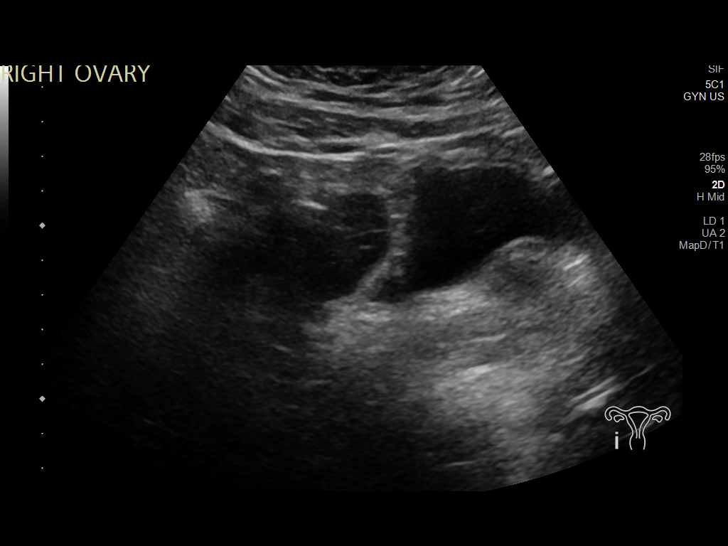
[im 13/75]
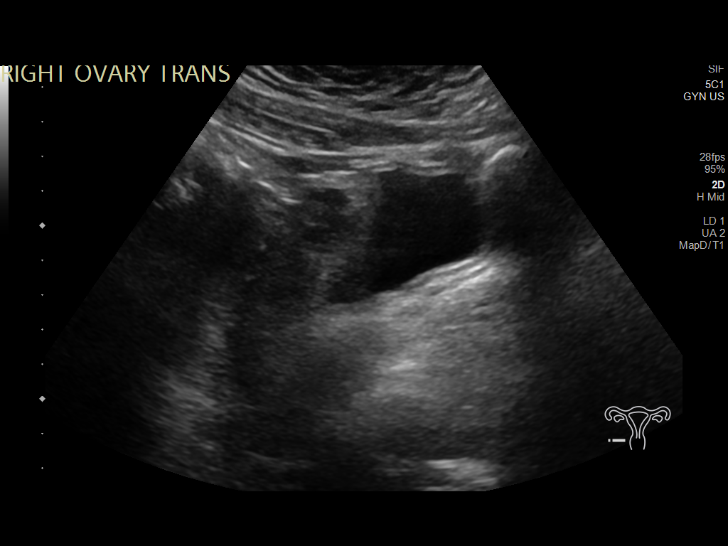
[im 19/75]
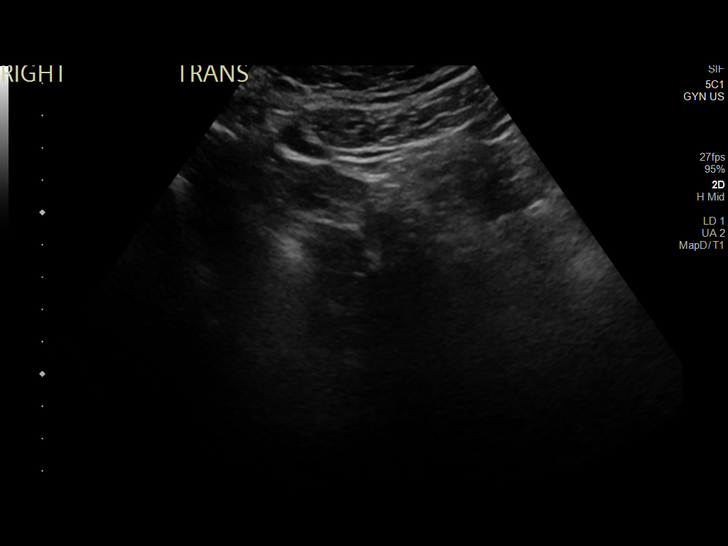
[im 25/75]
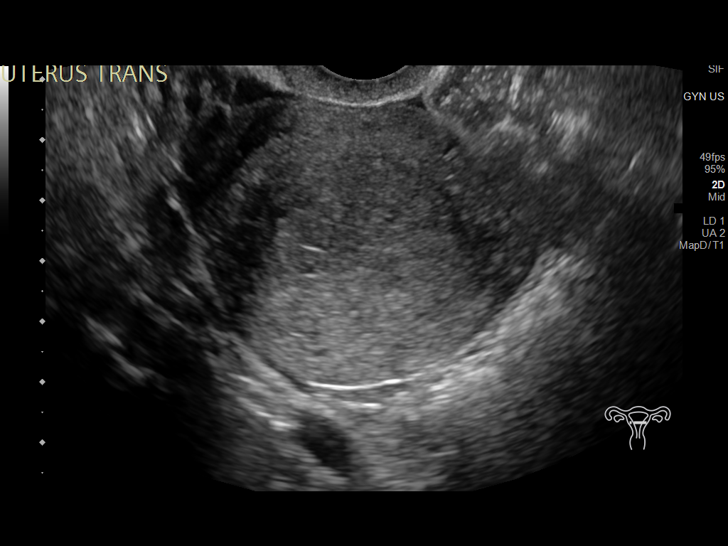
[im 28/75]
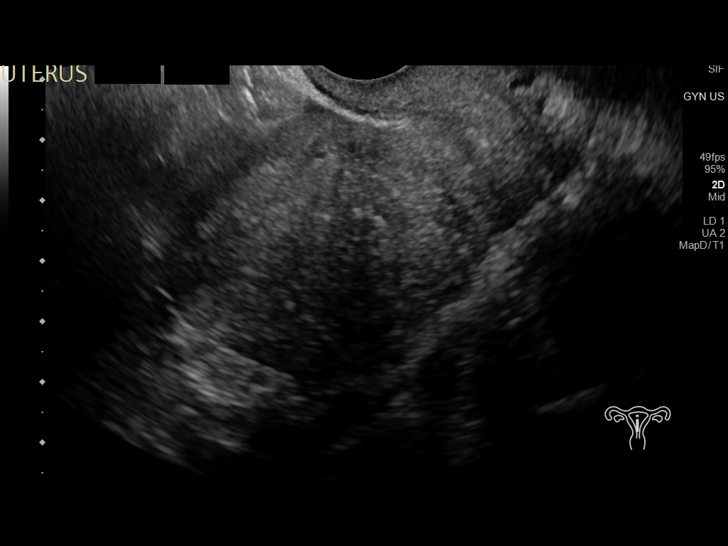
[im 34/75]
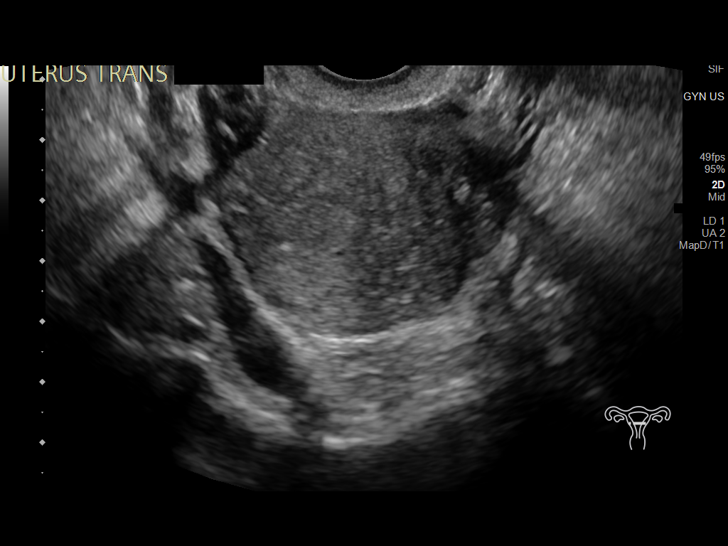
[im 41/75]
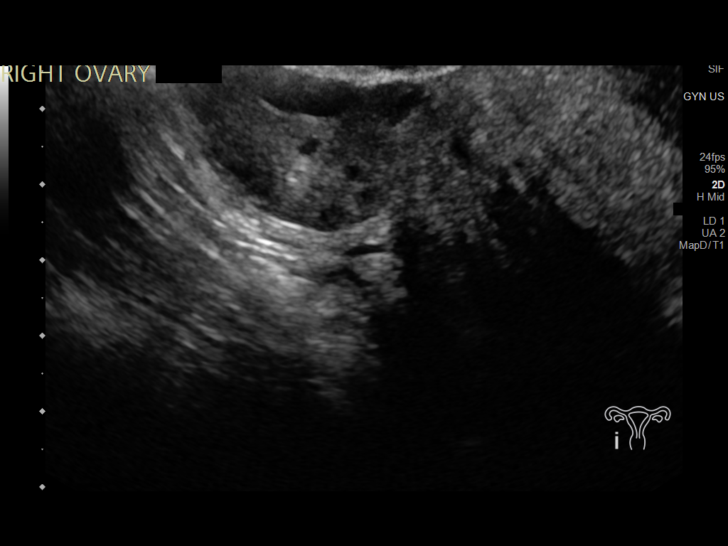
[im 47/75]
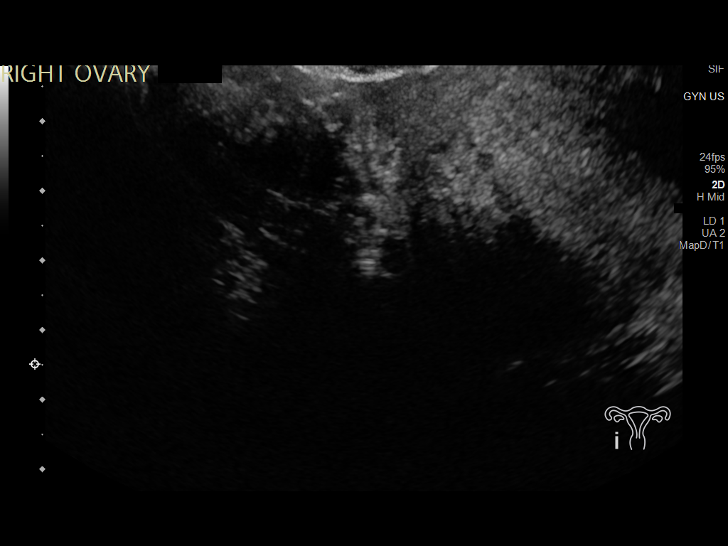
[im 50/75]
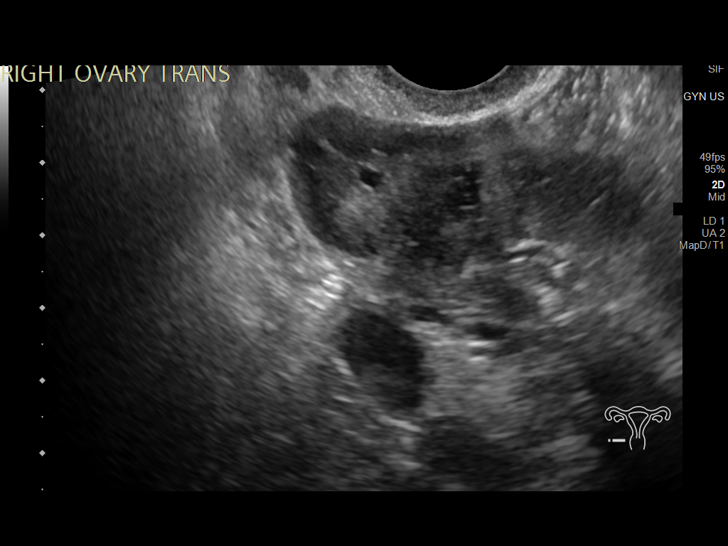
[im 56/75]
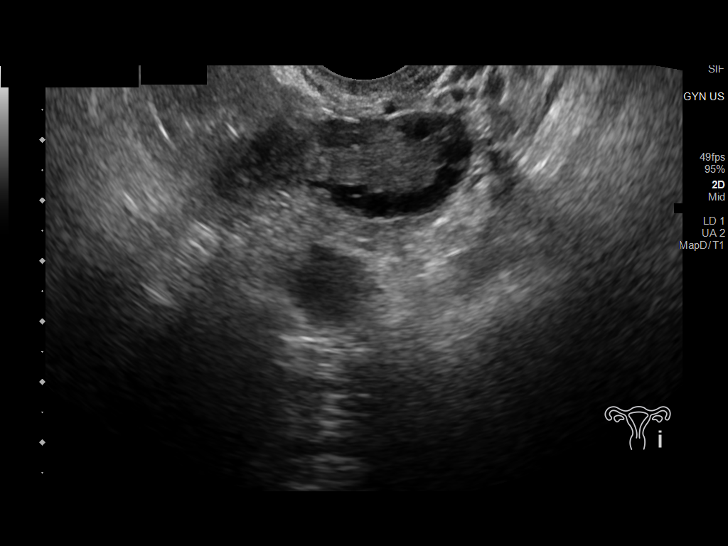
[im 62/75]
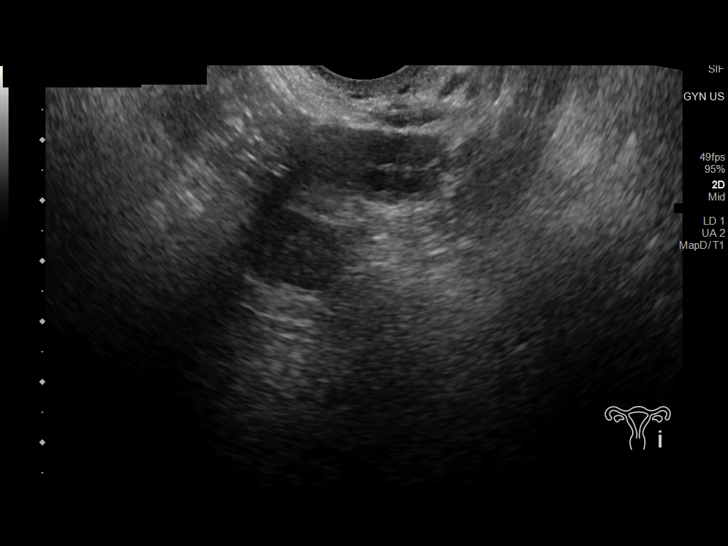
[im 68/75]
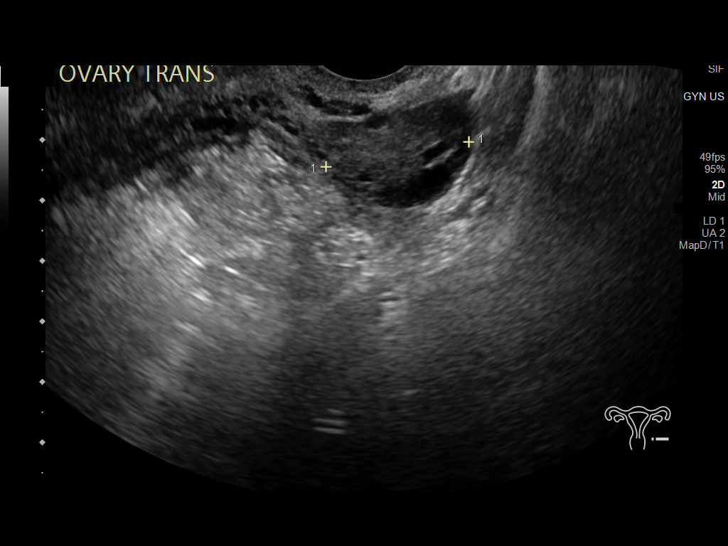
[im 75/75]
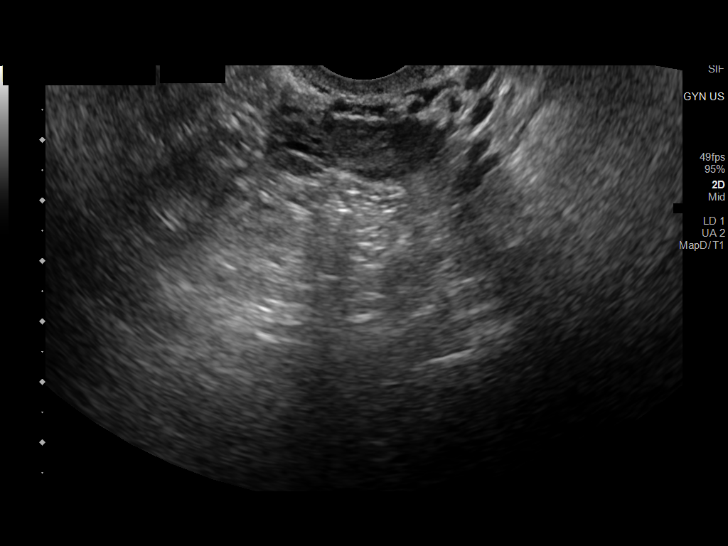

[14 of 25 positions shown; findings below may reference images not displayed]

FINDINGS: Uterus

Measurements: 7.8 x 5.0 x 5.2 cm = volume: 106 mL. Anteverted.
Normal morphology without mass

Endometrium

Thickness: 8 mm.  No endometrial fluid or mass

Right ovary

Measurements: 2.6 x 1.9 x 2.2 cm = volume: 5.8 mL. Normal morphology
without mass

Left ovary

Measurements: 2.5 x 1.6 x 2.4 cm = volume: 5.0 mL. Normal morphology
without mass

Other findings

No free pelvic fluid.  No adnexal masses.
IMPRESSION: Normal exam.

## 2024-02-28 ENCOUNTER — Encounter: Payer: Self-pay | Admitting: Family

## 2024-03-06 NOTE — Telephone Encounter (Signed)
 Noted

## 2024-05-21 ENCOUNTER — Ambulatory Visit: Admitting: Family
